# Patient Record
Sex: Female | Born: 1960 | Race: Black or African American | Hispanic: No | Marital: Single | Smoking: Former smoker
Health system: Southern US, Community
[De-identification: ages and names within clinical notes are randomized; demographics above are authoritative.]

## PROBLEM LIST (undated history)

## (undated) DIAGNOSIS — I340 Nonrheumatic mitral (valve) insufficiency: Secondary | ICD-10-CM

## (undated) DIAGNOSIS — E079 Disorder of thyroid, unspecified: Secondary | ICD-10-CM

## (undated) DIAGNOSIS — N183 Chronic kidney disease, stage 3 unspecified: Secondary | ICD-10-CM

## (undated) DIAGNOSIS — I2510 Atherosclerotic heart disease of native coronary artery without angina pectoris: Secondary | ICD-10-CM

## (undated) DIAGNOSIS — I10 Essential (primary) hypertension: Secondary | ICD-10-CM

## (undated) DIAGNOSIS — I313 Pericardial effusion (noninflammatory): Secondary | ICD-10-CM

## (undated) DIAGNOSIS — J45909 Unspecified asthma, uncomplicated: Secondary | ICD-10-CM

## (undated) DIAGNOSIS — I3139 Other pericardial effusion (noninflammatory): Secondary | ICD-10-CM

## (undated) DIAGNOSIS — M1990 Unspecified osteoarthritis, unspecified site: Secondary | ICD-10-CM

## (undated) DIAGNOSIS — Z8701 Personal history of pneumonia (recurrent): Secondary | ICD-10-CM

## (undated) DIAGNOSIS — R002 Palpitations: Secondary | ICD-10-CM

## (undated) HISTORY — DX: Disorder of thyroid, unspecified: E07.9

## (undated) HISTORY — DX: Unspecified asthma, uncomplicated: J45.909

## (undated) HISTORY — DX: Personal history of pneumonia (recurrent): Z87.01

## (undated) HISTORY — DX: Palpitations: R00.2

## (undated) HISTORY — DX: Chronic kidney disease, stage 3 unspecified: N18.30

## (undated) HISTORY — DX: Other pericardial effusion (noninflammatory): I31.39

## (undated) HISTORY — DX: Chronic kidney disease, stage 3 (moderate): N18.3

## (undated) HISTORY — DX: Essential (primary) hypertension: I10

## (undated) HISTORY — DX: Pericardial effusion (noninflammatory): I31.3

---

## 1999-10-27 ENCOUNTER — Encounter: Payer: Self-pay | Admitting: Emergency Medicine

## 1999-10-27 ENCOUNTER — Emergency Department (HOSPITAL_COMMUNITY): Admission: EM | Admit: 1999-10-27 | Discharge: 1999-10-27 | Payer: Self-pay | Admitting: Emergency Medicine

## 2010-05-02 DIAGNOSIS — I340 Nonrheumatic mitral (valve) insufficiency: Secondary | ICD-10-CM

## 2010-05-02 HISTORY — DX: Nonrheumatic mitral (valve) insufficiency: I34.0

## 2010-05-02 HISTORY — PX: PERICARDIOCENTESIS: SHX2215

## 2013-03-26 ENCOUNTER — Emergency Department (HOSPITAL_COMMUNITY)
Admission: EM | Admit: 2013-03-26 | Discharge: 2013-03-27 | Disposition: A | Discharge status: Home / Self Care | Payer: Medicaid Other | Attending: Emergency Medicine | Admitting: Emergency Medicine

## 2013-03-26 ENCOUNTER — Encounter (HOSPITAL_COMMUNITY): Payer: Self-pay | Admitting: Emergency Medicine

## 2013-03-26 DIAGNOSIS — J069 Acute upper respiratory infection, unspecified: Secondary | ICD-10-CM | POA: Insufficient documentation

## 2013-03-26 DIAGNOSIS — IMO0002 Reserved for concepts with insufficient information to code with codable children: Secondary | ICD-10-CM | POA: Insufficient documentation

## 2013-03-26 DIAGNOSIS — R5381 Other malaise: Secondary | ICD-10-CM | POA: Insufficient documentation

## 2013-03-26 DIAGNOSIS — Z79899 Other long term (current) drug therapy: Secondary | ICD-10-CM | POA: Insufficient documentation

## 2013-03-26 DIAGNOSIS — F3289 Other specified depressive episodes: Secondary | ICD-10-CM | POA: Insufficient documentation

## 2013-03-26 DIAGNOSIS — Z7982 Long term (current) use of aspirin: Secondary | ICD-10-CM | POA: Insufficient documentation

## 2013-03-26 DIAGNOSIS — R45851 Suicidal ideations: Secondary | ICD-10-CM | POA: Insufficient documentation

## 2013-03-26 DIAGNOSIS — R51 Headache: Secondary | ICD-10-CM | POA: Insufficient documentation

## 2013-03-26 DIAGNOSIS — I10 Essential (primary) hypertension: Secondary | ICD-10-CM | POA: Insufficient documentation

## 2013-03-26 DIAGNOSIS — F329 Major depressive disorder, single episode, unspecified: Secondary | ICD-10-CM

## 2013-03-26 LAB — URINALYSIS, ROUTINE W REFLEX MICROSCOPIC
Ketones, ur: NEGATIVE mg/dL
Nitrite: NEGATIVE
Protein, ur: NEGATIVE mg/dL
Specific Gravity, Urine: 1.024 (ref 1.005–1.030)
Urobilinogen, UA: 1 mg/dL (ref 0.0–1.0)

## 2013-03-26 LAB — COMPREHENSIVE METABOLIC PANEL
ALT: 38 U/L — ABNORMAL HIGH (ref 0–35)
AST: 42 U/L — ABNORMAL HIGH (ref 0–37)
Alkaline Phosphatase: 243 U/L — ABNORMAL HIGH (ref 39–117)
BUN: 13 mg/dL (ref 6–23)
CO2: 27 mEq/L (ref 19–32)
Calcium: 9.9 mg/dL (ref 8.4–10.5)
GFR calc Af Amer: 77 mL/min — ABNORMAL LOW (ref >90)
GFR calc non Af Amer: 66 mL/min — ABNORMAL LOW (ref >90)
Glucose, Bld: 90 mg/dL (ref 70–99)
Potassium: 3.7 mEq/L (ref 3.5–5.1)
Sodium: 138 mEq/L (ref 135–145)
Total Protein: 8 g/dL (ref 6.0–8.3)

## 2013-03-26 LAB — CBC WITH DIFFERENTIAL/PLATELET
Basophils Absolute: 0 10*3/uL (ref 0.0–0.1)
Basophils Relative: 0 % (ref 0–1)
Eosinophils Absolute: 0 10*3/uL (ref 0.0–0.7)
Eosinophils Relative: 0 % (ref 0–5)
HCT: 43.8 % (ref 36.0–46.0)
Lymphocytes Relative: 8 % — ABNORMAL LOW (ref 12–46)
Lymphs Abs: 0.5 10*3/uL — ABNORMAL LOW (ref 0.7–4.0)
MCHC: 34.9 g/dL (ref 30.0–36.0)
Monocytes Relative: 9 % (ref 3–12)
Platelets: 205 10*3/uL (ref 150–400)
WBC: 6.2 10*3/uL (ref 4.0–10.5)

## 2013-03-26 LAB — RAPID URINE DRUG SCREEN, HOSP PERFORMED
Cocaine: NOT DETECTED
Opiates: NOT DETECTED
Tetrahydrocannabinol: NOT DETECTED

## 2013-03-26 LAB — URINE MICROSCOPIC-ADD ON

## 2013-03-26 LAB — ETHANOL: Alcohol, Ethyl (B): <11 mg/dL (ref 0–11)

## 2013-03-26 MED ORDER — ZOLPIDEM TARTRATE 5 MG PO TABS: 5.0000 mg | ORAL_TABLET | Freq: Every evening | ORAL | Status: DC | PRN

## 2013-03-26 MED ORDER — IBUPROFEN 200 MG PO TABS: 600.0000 mg | ORAL_TABLET | Freq: Three times a day (TID) | ORAL | Status: DC | PRN

## 2013-03-26 MED ORDER — HYDROCODONE-ACETAMINOPHEN 5-325 MG PO TABS
2.0000 | ORAL_TABLET | Freq: Once | ORAL | Status: AC
  Administered 2013-03-26: 2 via ORAL
  Filled 2013-03-26: qty 2

## 2013-03-26 MED ORDER — METOCLOPRAMIDE HCL 5 MG/ML IJ SOLN
10.0000 mg | Freq: Once | INTRAMUSCULAR | Status: AC
  Administered 2013-03-26: 10 mg via INTRAVENOUS
  Filled 2013-03-26: qty 2

## 2013-03-26 MED ORDER — ONDANSETRON HCL 4 MG PO TABS: 4.0000 mg | ORAL_TABLET | Freq: Three times a day (TID) | ORAL | Status: DC | PRN

## 2013-03-26 MED ORDER — FLUTICASONE PROPIONATE 50 MCG/ACT NA SUSP: 2.0000 | Freq: Every day | NASAL | Status: DC

## 2013-03-26 MED ORDER — DIPHENHYDRAMINE HCL 50 MG/ML IJ SOLN
12.5000 mg | Freq: Once | INTRAMUSCULAR | Status: AC
  Administered 2013-03-26: 12.5 mg via INTRAVENOUS
  Filled 2013-03-26: qty 1

## 2013-03-26 MED ORDER — NICOTINE 21 MG/24HR TD PT24: 21.0000 mg | MEDICATED_PATCH | Freq: Every day | TRANSDERMAL | Status: DC

## 2013-03-26 MED ORDER — SODIUM CHLORIDE 0.9 % IV BOLUS (SEPSIS)
1000.0000 mL | Freq: Once | INTRAVENOUS | Status: AC
  Administered 2013-03-26: 1000 mL via INTRAVENOUS

## 2013-03-26 MED ORDER — LORAZEPAM 1 MG PO TABS: 1.0000 mg | ORAL_TABLET | Freq: Three times a day (TID) | ORAL | Status: DC | PRN

## 2013-03-26 MED ORDER — ACETAMINOPHEN 325 MG PO TABS
650.0000 mg | ORAL_TABLET | Freq: Once | ORAL | Status: AC
  Administered 2013-03-26: 650 mg via ORAL
  Filled 2013-03-26: qty 2

## 2013-03-26 MED ORDER — KETOROLAC TROMETHAMINE 30 MG/ML IJ SOLN
30.0000 mg | Freq: Once | INTRAMUSCULAR | Status: AC
  Administered 2013-03-26: 30 mg via INTRAVENOUS
  Filled 2013-03-26: qty 1

## 2013-03-26 MED ORDER — ALUM & MAG HYDROXIDE-SIMETH 200-200-20 MG/5ML PO SUSP: 30.0000 mL | ORAL | Status: DC | PRN

## 2013-03-26 NOTE — Discharge Instructions (Signed)
1. Medications: flonase, usual home medications 2. Treatment: rest, drink plenty of fluids, take OTC cold medication as needed, begin mucinex 3. Follow Up: Please followup with your primary doctor for discussion of your diagnoses and further evaluation after today's visit; if you do not have a primary care doctor use the resource guide provided to find one; Please also use the resources to find a counselor to discuss your depression.      Depression, Adult Depression refers to feeling sad, low, down in the dumps, blue, gloomy, or empty. In general, there are two kinds of depression: 1. Depression that we all experience from time to time because of upsetting life experiences, including the loss of a job or the ending of a relationship (normal sadness or normal grief). This kind of depression is considered normal, is short lived, and resolves within a few days to 2 weeks. (Depression experienced after the loss of a loved one is called bereavement. Bereavement often lasts longer than 2 weeks but normally gets better with time.) 2. Clinical depression, which lasts longer than normal sadness or normal grief or interferes with your ability to function at home, at work, and in school. It also interferes with your personal relationships. It affects almost every aspect of your life. Clinical depression is an illness. Symptoms of depression also can be caused by conditions other than normal sadness and grief or clinical depression. Examples of these conditions are listed as follows:  Physical illness Some physical illnesses, including underactive thyroid gland (hypothyroidism), severe anemia, specific types of cancer, diabetes, uncontrolled seizures, heart and lung problems, strokes, and chronic pain are commonly associated with symptoms of depression.  Side effects of some prescription medicine In some people, certain types of prescription medicine can cause symptoms of depression.  Substance abuse Abuse of  alcohol and illicit drugs can cause symptoms of depression. SYMPTOMS Symptoms of normal sadness and normal grief include the following:  Feeling sad or crying for short periods of time.  Not caring about anything (apathy).  Difficulty sleeping or sleeping too much.  No longer able to enjoy the things you used to enjoy.  Desire to be by oneself all the time (social isolation).  Lack of energy or motivation.  Difficulty concentrating or remembering.  Change in appetite or weight.  Restlessness or agitation. Symptoms of clinical depression include the same symptoms of normal sadness or normal grief and also the following symptoms:  Feeling sad or crying all the time.  Feelings of guilt or worthlessness.  Feelings of hopelessness or helplessness.  Thoughts of suicide or the desire to harm yourself (suicidal ideation).  Loss of touch with reality (psychotic symptoms). Seeing or hearing things that are not real (hallucinations) or having false beliefs about your life or the people around you (delusions and paranoia). DIAGNOSIS  The diagnosis of clinical depression usually is based on the severity and duration of the symptoms. Your caregiver also will ask you questions about your medical history and substance use to find out if physical illness, use of prescription medicine, or substance abuse is causing your depression. Your caregiver also may order blood tests. TREATMENT  Typically, normal sadness and normal grief do not require treatment. However, sometimes antidepressant medicine is prescribed for bereavement to ease the depressive symptoms until they resolve. The treatment for clinical depression depends on the severity of your symptoms but typically includes antidepressant medicine, counseling with a mental health professional, or a combination of both. Your caregiver will help to determine what treatment is  best for you. Depression caused by physical illness usually goes away  with appropriate medical treatment of the illness. If prescription medicine is causing depression, talk with your caregiver about stopping the medicine, decreasing the dose, or substituting another medicine. Depression caused by abuse of alcohol or illicit drugs abuse goes away with abstinence from these substances. Some adults need professional help in order to stop drinking or using drugs. SEEK IMMEDIATE CARE IF:  You have thoughts about hurting yourself or others.  You lose touch with reality (have psychotic symptoms).  You are taking medicine for depression and have a serious side effect. FOR MORE INFORMATION National Alliance on Mental Illness: www.nami.[employer] of Mental Health: http://www.maynard.net/ Document Released: 04/15/2000 Document Revised: 10/18/2011 Document Reviewed: 07/18/2011 [facility] Patient Information 2014 [place], [place].       Behavioral Health Resources in the [facility]  Intensive Outpatient Programs: [facility]      [address] [place], [place] [phone] Both a day and evening program       [facility] Outpatient     [address]        [place], [place] [address] [phone]         ADS: [facility] [address] [place] [phone]  [facility] Mental Health ACCESS LINE: [phone] or [phone] [address] [place], [place] [address] [email or web address]  Mobile Crisis Teams:                                        [facility]         Mobile Crisis Care Unit [phone]             [facility] [address] [place] Otto [phone]                                         [facility] [address] [place] [address] [place] [phone]  Self-Help/Support Groups: Mental Health Assoc[facility] of [employer] of support groups [phone] (call for more  info)  Narcotics Anonymous (NA) [facility] [address] [place] - 2 meetings at this location  Residential Treatment Programs:  [facility]      [address]        [place]       [phone]         [facility] [address], [place] [address] [place], [place]  [address] [phone]  [facility]  [address] [place], [place] [address] [phone] Admissions: 8am-3pm M-F  [facility]     [address]        [place], [place] [address]       [phone]         [facility] [address] [place], [place] [phone]  [facility] [address] [place], [place] [phone]  [facility] - Intensive Outpatient      [address]     [place], [place]       [phone]         [facility] ([facility]     [address] [place], [place] [phone] or [phone]  [facility] ([facility])  [address] [place], [place] [phone]  [facility] [address]                                               [address] [place] [phone]  [facility] Resources: [facility]217 [phone]               General Therapy                                                Angie Fava, PhD        [address] [place], [place] [address]         [phone]   Insurance  [facility]   [address] [place], [place] [address] [phone]  [facility] [address] [place], [place] [address] [phone] Insurance/Medicaid/sponsorship through [facility] and Families                                              [address]                                        [place], [place] [address]    Therapy/tele-psych/case         [phone]          [facility] [address], [place]  [address]   Adolescent/group home/case management [phone]                                           Creola Corn PhD       General therapy       Insurance   [phone]         Dr. Lolly Mustache Insurance [phone] M-F  [facility] Medicaid, sponsorship [phone]

## 2013-03-26 NOTE — ED Notes (Addendum)
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

## 2013-03-26 NOTE — ED Notes (Signed)
Pt has been changed into blue scrubs and pt belongings are at the bedside.

## 2013-03-26 NOTE — ED Notes (Signed)
Marchelle Folks from TTS consult called to inform that pt will have a telepsych scheduled at 2100.

## 2013-03-26 NOTE — ED Notes (Signed)
Pt personal medications with belongings: Levothyroxine , Carvedilol 3.125mg , Losartan 100mg , and OTC tylenol cold tablets.

## 2013-03-26 NOTE — ED Provider Notes (Signed)
CSN: [id number]     Arrival date & time 03/26/13  4403 History   First MD Initiated Contact with Patient 03/26/13 1825     Chief Complaint  Patient presents with  . Migraine   (Consider location/radiation/quality/duration/timing/severity/associated sxs/prior Treatment) Patient is a 52 y.o. female presenting with migraines. The history is provided by the patient and medical records. No language interpreter was used.  Migraine Associated symptoms include congestion, coughing, fatigue and headaches. Pertinent negatives include no abdominal pain, arthralgias, chest pain, chills, fever, myalgias, nausea, numbness, rash, sore throat or vomiting.    Suzanne Zuniga is a 52 y.o. female  with a hx of HTN presents to the Emergency Department complaining of gradual, persistent, progressively worsening temporal headache onset 2 days ago with associated nasal congestion, scratchy throat, rhinorrhea, dry cough, subjective fever and general malaise.  Pt denies neck stiffness, rash, changes in vision, thunderclap headache.  She has no hx of migraine headache.  Pt has been taking OTC cold medication without relief.  Pt denies chills, neck stiffness, chest pain, SOB, abd pain, N/V/D, weakness, dizziness, syncope, dysuria, hematuria.      She endorses increased stress at home.  She reports she is recently separated and living at someone else's home.  She endorses worthlessness and hopelessness.  She also reports vage thoughts of suicide for several weeks without a specific plan.  She denies firearms in the home.  She endorses Hx of suicide attempt in 2013 by lacerating her wrist. She denies HI, auditory/visual hallucinations.     Past Medical History  Diagnosis Date  . Hypertension   . Enlarged heart    Past Surgical History  Procedure Laterality Date  . Coronary stent placement     No family history on file. History  Substance Use Topics  . Smoking status: Never Smoker   . Smokeless tobacco: Not on file   . Alcohol Use: No   OB History   Grav Para Term Preterm Abortions TAB SAB Ect Mult Living                 Review of Systems  Constitutional: Positive for fatigue. Negative for fever, chills and appetite change.  HENT: Positive for congestion, postnasal drip, rhinorrhea and sinus pressure. Negative for ear discharge, ear pain, mouth sores and sore throat.   Eyes: Negative for visual disturbance.  Respiratory: Positive for cough. Negative for chest tightness, shortness of breath, wheezing and stridor.   Cardiovascular: Negative for chest pain, palpitations and leg swelling.  Gastrointestinal: Negative for nausea, vomiting, abdominal pain and diarrhea.  Genitourinary: Negative for dysuria, urgency, frequency and hematuria.  Musculoskeletal: Negative for arthralgias, back pain, myalgias and neck stiffness.  Skin: Negative for rash.  Neurological: Positive for headaches. Negative for syncope, light-headedness and numbness.  Hematological: Negative for adenopathy.  Psychiatric/Behavioral: Positive for suicidal ideas. The patient is not nervous/anxious.   All other systems reviewed and are negative.    Allergies  Review of patient's allergies indicates no known allergies.  Home Medications   Current Outpatient Rx  Name  Route  Sig  Dispense  Refill  . acetaminophen (TYLENOL) 500 MG tablet   Oral   Take 1,500 mg by mouth every 6 (six) hours as needed for moderate pain, fever or headache.         Marland Kitchen aspirin EC 81 MG tablet   Oral   Take 81 mg by mouth every morning.         . carvedilol (COREG) 3.125 MG tablet  Oral   Take 3.125 mg by mouth 2 (two) times daily.         Marland Kitchen levothyroxine (SYNTHROID, LEVOTHROID) 100 MCG tablet   Oral   Take 100 mcg by mouth daily before breakfast.         . losartan (COZAAR) 100 MG tablet   Oral   Take 100 mg by mouth every morning.         . Misc Natural Products (GINSENG COMPLEX PO)   Oral   Take 1 tablet by mouth every  morning.         . fluticasone (FLONASE) 50 MCG/ACT nasal spray   Each Nare   Place 2 sprays into both nostrils daily.   16 g   2    BP 98/48  Pulse 84  Temp(Src) 98.8 F (37.1 C) (Oral)  Resp 18  Ht 5\' 4"  (1.626 m)  Wt 167 lb (75.751 kg)  BMI 28.65 kg/m2  SpO2 96% Physical Exam  Constitutional: She is oriented to person, place, and time. She appears well-developed and well-nourished. No distress.  HENT:  Head: Normocephalic and atraumatic.  Right Ear: Tympanic membrane, external ear and ear canal normal.  Left Ear: Tympanic membrane, external ear and ear canal normal.  Nose: Mucosal edema and rhinorrhea present. No epistaxis. Right sinus exhibits no maxillary sinus tenderness and no frontal sinus tenderness. Left sinus exhibits no maxillary sinus tenderness and no frontal sinus tenderness.  Mouth/Throat: Uvula is midline, oropharynx is clear and moist and mucous membranes are normal. Mucous membranes are not pale and not cyanotic. No oropharyngeal exudate, posterior oropharyngeal edema, posterior oropharyngeal erythema or tonsillar abscesses.  Eyes: Conjunctivae are normal. Pupils are equal, round, and reactive to light.  Neck: Normal range of motion and full passive range of motion without pain.  Cardiovascular: Normal rate, normal heart sounds and intact distal pulses.   No murmur heard. Pulmonary/Chest: Effort normal and breath sounds normal. No stridor. No respiratory distress. She has no wheezes.  Abdominal: Soft. Normal appearance and bowel sounds are normal. She exhibits no distension. There is no tenderness. There is no rebound, no guarding and no CVA tenderness.  Musculoskeletal: Normal range of motion.  Lymphadenopathy:    She has no cervical adenopathy.  Neurological: She is alert and oriented to person, place, and time. No cranial nerve deficit. She exhibits normal muscle tone. Coordination normal.  Speech is clear and goal oriented, follows commands Major Cranial  nerves without deficit, no facial droop Normal strength in upper and lower extremities bilaterally including dorsiflexion and plantar flexion, strong and equal grip strength Sensation normal to light and sharp touch Moves extremities without ataxia, coordination intact Normal finger to nose and rapid alternating movements Neg romberg, no pronator drift Normal gait and balance  Skin: Skin is warm and dry. No rash noted. She is not diaphoretic. No erythema.  Pt hot to touch  Psychiatric: Her behavior is normal. She is not actively hallucinating. She exhibits a depressed mood. She expresses suicidal ideation. She expresses no homicidal ideation. She expresses no suicidal plans and no homicidal plans.    ED Course  Procedures (including critical care time) Labs Review Labs Reviewed  URINALYSIS, ROUTINE W REFLEX MICROSCOPIC - Abnormal; Notable for the following:    APPearance CLOUDY (*)    Leukocytes, UA MODERATE (*)    All other components within normal limits  CBC WITH DIFFERENTIAL - Abnormal; Notable for the following:    Hemoglobin 15.3 (*)    Neutrophils Relative %  83 (*)    Lymphocytes Relative 8 (*)    Lymphs Abs 0.5 (*)    All other components within normal limits  COMPREHENSIVE METABOLIC PANEL - Abnormal; Notable for the following:    AST 42 (*)    ALT 38 (*)    Alkaline Phosphatase 243 (*)    Total Bilirubin 1.5 (*)    GFR calc non Af Amer 66 (*)    GFR calc Af Amer 77 (*)    All other components within normal limits  URINE MICROSCOPIC-ADD ON - Abnormal; Notable for the following:    Squamous Epithelial / LPF FEW (*)    Bacteria, UA FEW (*)    All other components within normal limits  URINE CULTURE  URINE RAPID DRUG SCREEN (HOSP PERFORMED)  ETHANOL   Imaging Review No results found.  EKG Interpretation   None       MDM   1. Depression   2. Headache   3. URI (upper respiratory infection)      Suzanne Zuniga presents with headache without sudden onset or  thunderclap.  Pt with URI symptoms and increased stress at home.  During evaluation, pt endorses increased depression with vague SI and no plan.  Pt is at higher risk for suicide attempt as she has a Hx of same.    9:42 PM Discussed with Marchelle Folks of psyc who sees the same concerns I did, but she does not meet admission criteria.  She will give pt referrals for outpatient treatment.  Before d/c we will ensure that pt's friend with whom she currently lives will assist in ensuring that the patient is able to follow-up with [facility] or other outpatient resources.    10:51 PM Pt with resolution of headache.  She remains neurovascularly intact without deficit.  No CXR due to short duration of symptoms and clear and equal breath sounds. Patients symptoms are consistent with URI, likely viral etiology. Discussed that antibiotics are not indicated for viral infections. Pt will be discharged with symptomatic treatment.  Verbalizes understanding and is agreeable with plan. Pt is hemodynamically stable & in NAD prior to dc.  Pt's friend has been contacted and agrees to help with f/u.  She will contract for her safety and is to be d/c home.  It has been determined that no acute conditions requiring further emergency intervention are present at this time. The patient/guardian have been advised of the diagnosis and plan. We have discussed signs and symptoms that warrant return to the ED, such as changes or worsening in symptoms.   Vital signs are stable at discharge.   BP 98/48  Pulse 84  Temp(Src) 98.8 F (37.1 C) (Oral)  Resp 18  Ht 5\' 4"  (1.626 m)  Wt 167 lb (75.751 kg)  BMI 28.65 kg/m2  SpO2 96%  Patient/guardian has voiced understanding and agreed to follow-up with the PCP or specialist.       Dierdre Forth, PA-C 03/26/13 2256

## 2013-03-26 NOTE — BH Assessment (Signed)
[facility] Assessment Progress Note      Received request for TTS consult on Ms Janosik.  Spoke with Dahlia Client, EDPA, who requested the consult and she reprots Ms Yearwood is a 52 year old lday complaining of headache, but also endorsing SI-vague no plan, but history of attempt last year by slitting her wrist, separation, living with someone else.  Dahlia Client, [place], thinks pt is probably appropriate for discharge, but wants TTS consult to be sure.
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
## 2013-03-26 NOTE — Progress Notes (Signed)
EDCM spoke to patient at bedside.  Patient confirms she has Medicaid insurance.  EDCM questioned patient as to if she has a pcp.  Patent stated, "I have a doctor, but I'm in the process of switching doctors."  Patient does not know what physician she is switching to.  [facility] provided patient with a list of pcps who accept Medicaid insurance in [place].  Patient thankful for resources.  No further CM needs at this itme.

## 2013-03-26 NOTE — ED Notes (Signed)
Pt states  That she has been taking Tylenol cold and sinus but not helped her migraine that started earlier today. Pt denies n/v and blurred vision. Pt states that she bets her BP is high because it elevates every time she get sick.

## 2013-03-28 LAB — URINE CULTURE: Colony Count: >=100000

## 2013-03-29 NOTE — ED Provider Notes (Signed)
Medical screening examination/treatment/procedure(s) were performed by non-physician practitioner and as supervising physician I was immediately available for consultation/collaboration.  EKG Interpretation   None        Kamau Weatherall, MD 03/29/13 0837 

## 2013-12-16 ENCOUNTER — Ambulatory Visit: Payer: Medicaid Other | Admitting: Cardiology

## 2014-02-11 ENCOUNTER — Ambulatory Visit: Payer: Medicaid Other | Admitting: Cardiology

## 2014-02-11 ENCOUNTER — Encounter: Payer: Self-pay | Admitting: Cardiology

## 2014-02-11 ENCOUNTER — Ambulatory Visit: Payer: Self-pay | Admitting: Cardiology

## 2014-02-11 ENCOUNTER — Ambulatory Visit (INDEPENDENT_AMBULATORY_CARE_PROVIDER_SITE_OTHER): Payer: Medicaid Other | Admitting: Cardiology

## 2014-02-11 VITALS — BP 124/82 | HR 61 | Ht 64.0 in | Wt 176.5 lb

## 2014-02-11 DIAGNOSIS — I3139 Other pericardial effusion (noninflammatory): Secondary | ICD-10-CM

## 2014-02-11 DIAGNOSIS — I509 Heart failure, unspecified: Secondary | ICD-10-CM

## 2014-02-11 DIAGNOSIS — I313 Pericardial effusion (noninflammatory): Secondary | ICD-10-CM

## 2014-02-11 DIAGNOSIS — I10 Essential (primary) hypertension: Secondary | ICD-10-CM

## 2014-02-11 NOTE — Patient Instructions (Signed)
WILL OBTAIN INFORMATION FROM [place]  Your physician wants you to follow-up in 2 MONTHS DR HARDING.  You will receive a reminder letter in the mail two months in advance. If you don't receive a letter, please call our office to schedule the follow-up appointment.

## 2014-02-13 ENCOUNTER — Encounter: Payer: Self-pay | Admitting: Cardiology

## 2014-02-13 DIAGNOSIS — I3139 Other pericardial effusion (noninflammatory): Secondary | ICD-10-CM | POA: Insufficient documentation

## 2014-02-13 DIAGNOSIS — I10 Essential (primary) hypertension: Secondary | ICD-10-CM | POA: Insufficient documentation

## 2014-02-13 DIAGNOSIS — I313 Pericardial effusion (noninflammatory): Secondary | ICD-10-CM | POA: Insufficient documentation

## 2014-02-13 DIAGNOSIS — I509 Heart failure, unspecified: Secondary | ICD-10-CM | POA: Insufficient documentation

## 2014-02-13 NOTE — Assessment & Plan Note (Signed)
Well-controlled on ARB and carvedilol.

## 2014-02-13 NOTE — Assessment & Plan Note (Signed)
With abl gene, and no recurrent symptoms I don't think that this is an active issue. We began to get the old echocardiogram reports as well as potentially CT scan reports to have a baseline. Once we know more when dealing with a history standpoint and consider what we need to do for reevaluation.

## 2014-02-13 NOTE — Assessment & Plan Note (Signed)
This was listed as a possible diagnosis for her, he can't tell if he classically had heart failure symptoms or has any etiology for heart failure. Again once we know more what her baseline is we can work this up the story. For now patient seems very stable he is on an ARB and beta blocker, not requiring a diuretic.

## 2014-02-13 NOTE — Progress Notes (Signed)
PATIENT: Suzanne ClayWanda Done MRN: [id number] DOB: 30-Oct-1960 PCP: Margaretann LovelessKHAN,NEELAM S, MD  Clinic Note: Chief Complaint  Patient presents with  . New Evaluation    MOVED FROM [place], HEART ATTACK 2012 -FLUID AROUND HEART, INFECTED LUNG 2012, CHEST PAIN , CRAMPS AROUNDHEART SHARP,NUMBNES BOTH ARMS,    HPI: Suzanne Zuniga is a 53 y.o. female with a PMH below who presents today for establishing cardiology care. She has recently moved from [place]. She describes a rate difficult episode that occurred back in 2012 when she apparently was being treated for pneumonia she had a chest x-ray done that showed an enlarged heart. She was then sent to cardiology an echocardiogram probably that showed a large pericardial effusion. She subsequently went to the hospital and was treated with pericardiocentesis, she does none of the details of the procedure were the findings. I don't think there was a true diagnosis determined. She was then given a diagnosis of heart diet. Apparently during that hospitalization she had some complications one of which was that she suffered cardiac arrest and was resuscitated. Again I don't have the details of this..  Interval History: She now presents to establish cardiology care. She denies any heart failure symptoms of PND, orthopnea but does have some mild occasional edema. She basically has diffuse aching and cramping sensations in her chest and arms. She has occasional tiredness and dizziness. She really doesn't note any sharp or stabbing chest discomfort that is persistent or positional. She does have some intermittent episodes that are hard to define. No anginal sounding symptoms or dyspnea with rest or exertion. She denies any rapid or irregular heartbeat/arrhythmias, does have intermittent palpitations. She denies any syncope or near-syncope just fatigue.  Past Medical History  Diagnosis Date  . Essential hypertension   . Idiopathic pericardial effusion     Uncertain etiology; was in  setting of PNA has also been evaluated for inflammatory arthritis.;   . Thyroid disease     HYPOTHYROID  . History of pneumonia   . Reactive airway disease     No diagnosis listed, but on albuterol and Symbicort    Prior Cardiac Evaluation and Past Surgical History: Past Surgical History  Procedure Laterality Date  . Coronary stent placement      ?? no cath report  . Pericardiocentesis  2012    Allergies  Allergen Reactions  . Aliskiren   . Amlodipine   . Lisinopril   . Lithium     Current Outpatient Prescriptions  Medication Sig Dispense Refill  . acetaminophen (TYLENOL) 500 MG tablet Take 1,500 mg by mouth every 6 (six) hours as needed for moderate pain, fever or headache.      . albuterol (PROAIR HFA) 108 (90 BASE) MCG/ACT inhaler Inhale 2 puffs into the lungs.      Marland Kitchen. aspirin EC 81 MG tablet Take 81 mg by mouth every morning.      . budesonide-formoterol (SYMBICORT) 160-4.5 MCG/ACT inhaler Inhale 2 puffs into the lungs.      . carvedilol (COREG) 3.125 MG tablet Take 3.125 mg by mouth 2 (two) times daily.      . citalopram (CELEXA) 20 MG tablet Take 20 mg by mouth daily.      . fluticasone (FLONASE) 50 MCG/ACT nasal spray Place 2 sprays into both nostrils daily.  16 g  2  . levothyroxine (SYNTHROID, LEVOTHROID) 100 MCG tablet Take 100 mcg by mouth daily before breakfast.      . losartan (COZAAR) 100 MG tablet Take 100 mg by  mouth every morning.       No current facility-administered medications for this visit.    History   Social History Narrative   Single. Former smoker. Occasional alcohol.    family history is not on file. by her report she denies any family history of heart disease or arrhythmias. No prior pericardial effusions.   ROS: A comprehensive Review of Systems - was performed Review of Systems  Constitutional:       Tiredness, but not fatigue  HENT: Negative for congestion, hearing loss, nosebleeds and tinnitus.   Respiratory: Negative for cough,  shortness of breath and wheezing.   Cardiovascular: Negative for chest pain.  Gastrointestinal: Negative for blood in stool and melena.  Genitourinary: Negative for dysuria, frequency, hematuria and flank pain.  Musculoskeletal: Positive for back pain, joint pain, myalgias and neck pain. Negative for falls.  Neurological: Positive for dizziness. Negative for sensory change, speech change, focal weakness, seizures, loss of consciousness and headaches.  All other systems reviewed and are negative.   PHYSICAL EXAM BP 124/82  Pulse 61  Ht 5\' 4"  (1.626 m)  Wt 176 lb 8 oz (80.06 kg)  BMI 30.28 kg/m2 General appearance: alert, cooperative, appears stated age, no distress, mildly obese and Well-groomed Neck: no adenopathy, no carotid bruit, no JVD and supple, symmetrical, trachea midline Lungs: clear to auscultation bilaterally, normal percussion bilaterally and Nonlabored, good air movement Heart: regular rate and rhythm, S1, S2 normal, no murmur, click, rub or gallop and normal apical impulse Abdomen: soft, non-tender; bowel sounds normal; no masses,  no organomegaly Extremities: extremities normal, atraumatic, no cyanosis or edema Pulses: 2+ and symmetric Skin: Skin color, texture, turgor normal. No rashes or lesions Neurologic: Alert and oriented X 3, normal strength and tone. Normal symmetric reflexes. Normal coordination and gait Cranial nerves: normal   Adult ECG Report  Rate: 61 ;  Rhythm: normal sinus rhythm, anterolateral T wave inversions and poor wave progression. Cannot exclude ischemia. No previous EKG  Recent Labs: No recent labs  ASSESSMENT / PLAN: Very unfortunate situation to him see a patient without any past medical records. She seems to be having some atypical sounding discomforts that are not cardiac in nature. She doesn't seem to have any symptoms to suggest recurrence or pericardial effusion. No heart failure symptoms no pleuritic type pains. I think she had to go on  last year, I can't be sure. The main focus for now is going to be to get her old records in order to know more about dealing with see her in followup. For now we'll not have any significant symptoms, there is no need to preemptively take any steps towards echocardiogram or stress test.  Idiopathic pericardial effusion With abl gene, and no recurrent symptoms I don't think that this is an active issue. We began to get the old echocardiogram reports as well as potentially CT scan reports to have a baseline. Once we know more when dealing with a history standpoint and consider what we need to do for reevaluation.  Essential hypertension Well-controlled on ARB and carvedilol.  Chronic heart failure This was listed as a possible diagnosis for her, he can't tell if he classically had heart failure symptoms or has any etiology for heart failure. Again once we know more what her baseline is we can work this up the story. For now patient seems very stable he is on an ARB and beta blocker, not requiring a diuretic.    No orders of the defined types were  placed in this encounter.   Meds ordered this encounter  Medications  . albuterol (PROAIR HFA) 108 (90 BASE) MCG/ACT inhaler    Sig: Inhale 2 puffs into the lungs.  . budesonide-formoterol (SYMBICORT) 160-4.5 MCG/ACT inhaler    Sig: Inhale 2 puffs into the lungs.  . citalopram (CELEXA) 20 MG tablet    Sig: Take 20 mg by mouth daily.    Followup: 2  Months - but without point we will have her chart from [employer]. Herbie Baltimore, M.D., M.S. [facility]

## 2014-03-07 NOTE — Addendum Note (Signed)
Addended by: Abram SanderSIGMON, Mariesha Venturella J on: 03/07/2014 03:30 PM   Modules accepted: Orders

## 2014-04-16 ENCOUNTER — Ambulatory Visit: Payer: Medicaid Other | Admitting: Cardiology

## 2014-04-22 ENCOUNTER — Ambulatory Visit (INDEPENDENT_AMBULATORY_CARE_PROVIDER_SITE_OTHER): Payer: Medicaid Other | Admitting: Cardiology

## 2014-04-22 ENCOUNTER — Encounter: Payer: Self-pay | Admitting: Cardiology

## 2014-04-22 VITALS — BP 142/85 | HR 79 | Ht 64.0 in | Wt 187.2 lb

## 2014-04-22 DIAGNOSIS — I509 Heart failure, unspecified: Secondary | ICD-10-CM

## 2014-04-22 DIAGNOSIS — R0600 Dyspnea, unspecified: Secondary | ICD-10-CM

## 2014-04-22 DIAGNOSIS — E669 Obesity, unspecified: Secondary | ICD-10-CM

## 2014-04-22 DIAGNOSIS — I10 Essential (primary) hypertension: Secondary | ICD-10-CM

## 2014-04-22 DIAGNOSIS — I3139 Other pericardial effusion (noninflammatory): Secondary | ICD-10-CM

## 2014-04-22 DIAGNOSIS — I313 Pericardial effusion (noninflammatory): Secondary | ICD-10-CM

## 2014-04-22 DIAGNOSIS — I319 Disease of pericardium, unspecified: Secondary | ICD-10-CM

## 2014-04-22 NOTE — Patient Instructions (Signed)
Your physician has requested that you have an echocardiogram. Echocardiography is a painless test that uses sound waves to create images of your heart. It provides your doctor with information about the size and shape of your heart and how well your heart's chambers and valves are working. This procedure takes approximately one hour. There are no restrictions for this procedure.  Your physician wants you to follow-up in: 6 months with Dr. Herbie BaltimoreHarding. You will receive a reminder letter in the mail two months in advance. If you don't receive a letter, please call our office to schedule the follow-up appointment.

## 2014-04-23 ENCOUNTER — Encounter: Payer: Self-pay | Admitting: Cardiology

## 2014-04-23 DIAGNOSIS — E669 Obesity, unspecified: Secondary | ICD-10-CM | POA: Insufficient documentation

## 2014-04-23 DIAGNOSIS — R0600 Dyspnea, unspecified: Secondary | ICD-10-CM | POA: Insufficient documentation

## 2014-04-23 DIAGNOSIS — R0609 Other forms of dyspnea: Secondary | ICD-10-CM | POA: Insufficient documentation

## 2014-04-23 NOTE — Progress Notes (Signed)
PATIENT: Suzanne ClayWanda Zuniga MRN: [id number] DOB: 12-24-1960 PCP: Margaretann LovelessKHAN,NEELAM S, MD  Clinic Note: Chief Complaint  Patient presents with  . Follow-up    2 month follow up, Pt. experiencing chest pressure, chest pain, SOB, and swelling from fall.   HPI: Suzanne Zuniga is a 53 y.o. female with prior history of likely parapneumonic pericardial effusion status post pericardiocentesis back in 2012. There was reported "heart attack at that time, but no documented CAD. I will see her back now for 2 months follow-up, but unfortunately do not yet have records from [facility].apparently there is a report of possible PCI, however she does not remember having a heart catheterization.  Interval History: She now presents for follow-up visit, unfortunately I don't have any more records to go from. She has some diffuse mild symptoms that are likely musculoskeletal in nature with mild aches and pains in the chest. Most of what she is feeling now is residual symptoms from a fall down the steps where she hit her back and chest as well as pain. She has some bruising in her chest and so her to take deep breath. No real resting or exertional chest tightness or pressure consistent with angina. Only notes exertional dyspnea when she really pushes it. She denies any heart failure symptoms of PND, orthopnea but does have some mild occasional edema. She basically has diffuse aching and cramping sensations in her chest and arms. She has occasional tiredness and dizziness. She denies any rapid or irregular heartbeat/arrhythmias, does have intermittent palpitations. She denies any syncope or near-syncope just fatigue.  Past Medical History  Diagnosis Date  . Essential hypertension   . Idiopathic pericardial effusion     Uncertain etiology; was in setting of PNA has also been evaluated for inflammatory arthritis.;   . Thyroid disease     HYPOTHYROID  . History of pneumonia   . Reactive airway disease     No diagnosis  listed, but on albuterol and Symbicort    Prior Cardiac Evaluation and Past Surgical History: Past Surgical History  Procedure Laterality Date  . Pericardiocentesis  2012    Likely parapneumonic    Allergies  Allergen Reactions  . Aliskiren   . Amlodipine   . Lisinopril   . Lithium     Current Outpatient Prescriptions  Medication Sig Dispense Refill  . albuterol (PROAIR HFA) 108 (90 BASE) MCG/ACT inhaler Inhale 2 puffs into the lungs.    Marland Kitchen. aspirin EC 81 MG tablet Take 81 mg by mouth every morning.    . budesonide-formoterol (SYMBICORT) 160-4.5 MCG/ACT inhaler Inhale 2 puffs into the lungs.    . carvedilol (COREG) 3.125 MG tablet Take 3.125 mg by mouth 2 (two) times daily.    . citalopram (CELEXA) 20 MG tablet Take 20 mg by mouth daily.    . fluticasone (FLONASE) 50 MCG/ACT nasal spray Place 2 sprays into both nostrils daily. 16 g 2  . levothyroxine (SYNTHROID, LEVOTHROID) 100 MCG tablet Take 100 mcg by mouth daily before breakfast.    . losartan (COZAAR) 100 MG tablet Take 100 mg by mouth every morning.    Marland Kitchen. ibuprofen (ADVIL,MOTRIN) 600 MG tablet Take 600 mg by mouth every 6 (six) hours as needed.  0   No current facility-administered medications for this visit.   No Significant Change in Social and Family History  ROS: A comprehensive Review of Systems - was performed Review of Systems  Constitutional: Positive for malaise/fatigue (Due to poor sleep).  Tiredness, but not fatigue  HENT: Negative for congestion, hearing loss, nosebleeds and tinnitus.   Respiratory: Negative for cough, shortness of breath and wheezing.   Cardiovascular: Negative for chest pain.  Gastrointestinal: Negative for blood in stool and melena.  Genitourinary: Negative for dysuria, frequency, hematuria and flank pain.  Musculoskeletal: Positive for myalgias, back pain, joint pain and neck pain. Negative for falls.  Neurological: Positive for dizziness. Negative for sensory change, speech change,  focal weakness, seizures, loss of consciousness and headaches.  Endo/Heme/Allergies: Does not bruise/bleed easily.  Psychiatric/Behavioral: Negative for memory loss. The patient is nervous/anxious and has insomnia (Has a hard time sleeping with teenagers and young adults in the house. Quite stressed out about Christmas time holidays).   All other systems reviewed and are negative.   PHYSICAL EXAM BP 142/85 mmHg  Pulse 79  Ht 5\' 4"  (1.626 m)  Wt 187 lb 3.2 oz (84.913 kg)  BMI 32.12 kg/m2 General appearance: alert, cooperative, appears stated age, no distress, mildly obese and Well-groomed Neck: no adenopathy, no carotid bruit, no JVD and supple, symmetrical, trachea midline Lungs: clear to auscultation bilaterally, normal percussion bilaterally and Nonlabored, good air movement Heart: regular rate and rhythm, S1, S2 normal, no murmur, click, rub or gallop and normal apical impulse Abdomen: soft, non-tender; bowel sounds normal; no masses,  no organomegaly Extremities: extremities normal, atraumatic, no cyanosis or edema Pulses: 2+ and symmetric Skin: Skin color, texture, turgor normal. No rashes or lesions Neurologic: Alert and oriented X 3, normal strength and tone. Normal symmetric reflexes. Normal coordination and gait Cranial nerves: normal   Adult ECG Report -not performed   Recent Labs: No recent labs  ASSESSMENT / PLAN: Unfortunately, I don't have any records of. I would like to have a baseline sense of her cardiac function and plus or minus pericardial effusion. We'll check an echocardiogram for that baseline assessment. Otherwise relatively stable cardiac standpoint.   Idiopathic pericardial effusion Most likely was in the setting of a pneumonia, therefore parapneumonic. However she does have a history of positive ANA titers.  Plan: Check baseline echocardiogram just to ensure no recurrent effusions. We'll also assess EF.  Chronic heart failure Listed as a possible  diagnosis. She is on carvedilol and ARB. Since she has not had an echo in over a year I think is not unreasonable to go ahead with an echocardiogram now to get a new baseline for her. No active heart failure symptoms requiring diuretic.  Essential hypertension Relatively well-controlled on ARB and carvedilol. Her pressure is low but higher today, but she is under a great deal of stress.  Obesity (BMI 30-39.9) We discussed importance of dietary modification and exercise. Since she is not working, she should have the time to be a get basic exercise such as walking. She would like to see results the echocardiogram before starting down this road.  Dyspnea Cannot be certain that she has dyspnea from heart failure or baseline lung disease. Currently does not a major issue for her, possibly because she just not very active.  2-D echocardiogram will help assess if there is any systolic or diastolic heart failure component versus possible persistent Effusion.    Orders Placed This Encounter  Procedures  . 2D Echocardiogram without contrast    Standing Status: Future     Number of Occurrences:      Standing Expiration Date: 04/23/2015    Order Specific Question:  Type of Echo    Answer:  Complete    Order Specific  Question:  Where should this test be performed    Answer:  [facility]    Order Specific Question:  Reason for exam-Echo    Answer:  Dyspnea  786.09 / R06.00   Meds ordered this encounter  Medications  . ibuprofen (ADVIL,MOTRIN) 600 MG tablet    Sig: Take 600 mg by mouth every 6 (six) hours as needed.    Refill:  0    Followup: 2  Months - but without point we will have her chart from [employer]. Herbie Baltimore, M.D., M.S. [facility]

## 2014-04-23 NOTE — Assessment & Plan Note (Signed)
Cannot be certain that she has dyspnea from heart failure or baseline lung disease. Currently does not a major issue for her, possibly because she just not very active.  2-D echocardiogram will help assess if there is any systolic or diastolic heart failure component versus possible persistent Effusion.

## 2014-04-23 NOTE — Assessment & Plan Note (Signed)
We discussed importance of dietary modification and exercise. Since she is not working, she should have the time to be a get basic exercise such as walking. She would like to see results the echocardiogram before starting down this road.

## 2014-04-23 NOTE — Assessment & Plan Note (Signed)
Listed as a possible diagnosis. She is on carvedilol and ARB. Since she has not had an echo in over a year I think is not unreasonable to go ahead with an echocardiogram now to get a new baseline for her. No active heart failure symptoms requiring diuretic.

## 2014-04-23 NOTE — Assessment & Plan Note (Signed)
Most likely was in the setting of a pneumonia, therefore parapneumonic. However she does have a history of positive ANA titers.  Plan: Check baseline echocardiogram just to ensure no recurrent effusions. We'll also assess EF.

## 2014-04-23 NOTE — Assessment & Plan Note (Signed)
Relatively well-controlled on ARB and carvedilol. Her pressure is low but higher today, but she is under a great deal of stress.

## 2014-04-24 ENCOUNTER — Emergency Department (HOSPITAL_COMMUNITY)
Admission: EM | Admit: 2014-04-24 | Discharge: 2014-04-24 | Disposition: A | Discharge status: Home / Self Care | Payer: Medicaid Other | Attending: Emergency Medicine | Admitting: Emergency Medicine

## 2014-04-24 ENCOUNTER — Encounter (HOSPITAL_COMMUNITY): Payer: Self-pay | Admitting: Emergency Medicine

## 2014-04-24 ENCOUNTER — Emergency Department (HOSPITAL_COMMUNITY): Payer: Medicaid Other

## 2014-04-24 ENCOUNTER — Telehealth: Payer: Self-pay | Admitting: Cardiology

## 2014-04-24 DIAGNOSIS — I10 Essential (primary) hypertension: Secondary | ICD-10-CM | POA: Diagnosis not present

## 2014-04-24 DIAGNOSIS — E039 Hypothyroidism, unspecified: Secondary | ICD-10-CM | POA: Insufficient documentation

## 2014-04-24 DIAGNOSIS — Z7951 Long term (current) use of inhaled steroids: Secondary | ICD-10-CM | POA: Diagnosis not present

## 2014-04-24 DIAGNOSIS — J3489 Other specified disorders of nose and nasal sinuses: Secondary | ICD-10-CM | POA: Insufficient documentation

## 2014-04-24 DIAGNOSIS — Z7982 Long term (current) use of aspirin: Secondary | ICD-10-CM | POA: Diagnosis not present

## 2014-04-24 DIAGNOSIS — Z8701 Personal history of pneumonia (recurrent): Secondary | ICD-10-CM | POA: Insufficient documentation

## 2014-04-24 DIAGNOSIS — R0981 Nasal congestion: Secondary | ICD-10-CM | POA: Diagnosis not present

## 2014-04-24 DIAGNOSIS — J45909 Unspecified asthma, uncomplicated: Secondary | ICD-10-CM | POA: Insufficient documentation

## 2014-04-24 DIAGNOSIS — R059 Cough, unspecified: Secondary | ICD-10-CM

## 2014-04-24 DIAGNOSIS — Z79899 Other long term (current) drug therapy: Secondary | ICD-10-CM | POA: Diagnosis not present

## 2014-04-24 DIAGNOSIS — Z87891 Personal history of nicotine dependence: Secondary | ICD-10-CM | POA: Diagnosis not present

## 2014-04-24 DIAGNOSIS — R05 Cough: Secondary | ICD-10-CM | POA: Diagnosis not present

## 2014-04-24 NOTE — Telephone Encounter (Signed)
Faxed signed release of information to [facility] in [place] per Dr Erich MontaneHardings request for records.  Faxed on 04/24/14. lp

## 2014-04-24 NOTE — ED Notes (Signed)
Pt states she has congestion building up in her chest and she is blowing her nose a lot  Pt states her sxs started 5 days ago

## 2014-04-24 NOTE — Discharge Instructions (Signed)
Please follow-up with your primary care physician. Return to the ED for new or worsening symptoms.

## 2014-04-24 NOTE — ED Notes (Signed)
Patient transported to X-ray 

## 2014-04-24 NOTE — ED Notes (Addendum)
Upon Discharge, Pt was upset and stating that the providers never came to talk to her, prior to setting her disposition.  Primary RN asked this Charge RN to speak w/ the Pt.  This Consulting civil engineerCharge RN apologized to the Pt and asked if she had any questions that we could address.  Pt was educated that she was diagnosed w/ nasal congestion and cough, it would need to run it's course, and she could talk to a pharmacist about appropriate OTC medications.  Pt verbalized understanding, but stated that she would be contacting administration concerning her provider complaints.  This Charge RN assured her that I would share her concerns with department leadership and the physician team.

## 2014-04-24 NOTE — ED Provider Notes (Signed)
CSN: [id number]     Arrival date & time 04/24/14  0507 History   First MD Initiated Contact with Patient 04/24/14 [phone]     Chief Complaint  Patient presents with  . Nasal Congestion     (Consider location/radiation/quality/duration/timing/severity/associated sxs/prior Treatment) The history is provided by the patient and medical records.    This is a 53 year old female with past medical history significant for hypertension, hypothyroidism, asthma, presenting to the ED for cough and nasal congestion. Patient states symptoms initially started 5 days ago and have been progressively worsening. She notes clear rhinorrhea which she states "is traveling into her chest". States she has a nonproductive cough, continues feeling congestion in her chest. She denies any chest pain or shortness of breath. No fever or chills. No known sick contacts. No intervention tried prior to arrival.  Past Medical History  Diagnosis Date  . Essential hypertension   . Idiopathic pericardial effusion     Uncertain etiology; was in setting of PNA has also been evaluated for inflammatory arthritis.;   . Thyroid disease     HYPOTHYROID  . History of pneumonia   . Reactive airway disease     No diagnosis listed, but on albuterol and Symbicort   Past Surgical History  Procedure Laterality Date  . Pericardiocentesis  2012    Likely parapneumonic   Family History  Problem Relation Age of Onset  . Diabetes Other   . Hypertension Other    History  Substance Use Topics  . Smoking status: Former Games developermoker  . Smokeless tobacco: Not on file     Comment: SMOKED IN HIGH SCHOOL  . Alcohol Use: Yes     Comment: WINE OCCASIONALLY   OB History    No data available     Review of Systems  HENT: Positive for congestion and rhinorrhea.   Respiratory: Positive for cough.   All other systems reviewed and are negative.     Allergies  Aliskiren; Amlodipine; Lisinopril; and Lithium  Home Medications   Prior to  Admission medications   Medication Sig Start Date End Date Taking? Authorizing Provider  albuterol (PROAIR HFA) 108 (90 BASE) MCG/ACT inhaler Inhale 2 puffs into the lungs. 06/28/13   Historical Provider, MD  aspirin EC 81 MG tablet Take 81 mg by mouth every morning.    Historical Provider, MD  budesonide-formoterol (SYMBICORT) 160-4.5 MCG/ACT inhaler Inhale 2 puffs into the lungs. 06/28/13   Historical Provider, MD  carvedilol (COREG) 3.125 MG tablet Take 3.125 mg by mouth 2 (two) times daily.    Historical Provider, MD  citalopram (CELEXA) 20 MG tablet Take 20 mg by mouth daily.    Historical Provider, MD  fluticasone (FLONASE) 50 MCG/ACT nasal spray Place 2 sprays into both nostrils daily. 03/26/13   Hannah Muthersbaugh, PA-C  ibuprofen (ADVIL,MOTRIN) 600 MG tablet Take 600 mg by mouth every 6 (six) hours as needed. 04/08/14   Historical Provider, MD  levothyroxine (SYNTHROID, LEVOTHROID) 100 MCG tablet Take 100 mcg by mouth daily before breakfast.    Historical Provider, MD  losartan (COZAAR) 100 MG tablet Take 100 mg by mouth every morning.    Historical Provider, MD   BP 124/70 mmHg  Pulse 78  Temp(Src) 98 F (36.7 C) (Oral)  Resp 18  Ht 5\' 4"  (1.626 m)  Wt 180 lb (81.647 kg)  BMI 30.88 kg/m2  SpO2 97%   Physical Exam  Constitutional: She is oriented to person, place, and time. She appears well-developed and well-nourished. No distress.  HENT:  Head: Normocephalic and atraumatic.  Right Ear: Tympanic membrane and ear canal normal.  Left Ear: Tympanic membrane and ear canal normal.  Nose: Mucosal edema and rhinorrhea present.  Mouth/Throat: Uvula is midline, oropharynx is clear and moist and mucous membranes are normal. No oropharyngeal exudate, posterior oropharyngeal edema, posterior oropharyngeal erythema or tonsillar abscesses.  Eyes: Conjunctivae and EOM are normal. Pupils are equal, round, and reactive to light.  Neck: Normal range of motion. Neck supple.  Cardiovascular:  Normal rate, regular rhythm and normal heart sounds.   Pulmonary/Chest: Effort normal and breath sounds normal. No respiratory distress. She has no wheezes. She has no rhonchi. She has no rales.  Musculoskeletal: Normal range of motion.  Neurological: She is alert and oriented to person, place, and time.  Skin: Skin is warm and dry. She is not diaphoretic.  Psychiatric: She has a normal mood and affect.  Nursing note and vitals reviewed.   ED Course  Procedures (including critical care time) Labs Review Labs Reviewed - No data to display  Imaging Review No results found.   EKG Interpretation None      MDM   Final diagnoses:  Cough  Nasal congestion   53 y.o. F with cough and nasal congestion.  No chest pain or SOB.  Patient afebrile and non-toxic, VS stable on RA.  CXR will be obtained.  6:11 AM CXR pending.  Care signed out to Dr. Read DriversMolpus.  Will follow results and dispo accordingly.  Garlon HatchetLisa M Slate Debroux, PA-C 04/24/14 [id number]  Suzanne SeamenJohn L Molpus, MD 04/24/14 [phone]

## 2014-04-26 ENCOUNTER — Emergency Department (HOSPITAL_COMMUNITY)
Admission: EM | Admit: 2014-04-26 | Discharge: 2014-04-26 | Disposition: A | Discharge status: Home / Self Care | Payer: Medicaid Other | Attending: Emergency Medicine | Admitting: Emergency Medicine

## 2014-04-26 ENCOUNTER — Encounter (HOSPITAL_COMMUNITY): Payer: Self-pay | Admitting: Oncology

## 2014-04-26 DIAGNOSIS — J45909 Unspecified asthma, uncomplicated: Secondary | ICD-10-CM | POA: Insufficient documentation

## 2014-04-26 DIAGNOSIS — Z8701 Personal history of pneumonia (recurrent): Secondary | ICD-10-CM | POA: Diagnosis not present

## 2014-04-26 DIAGNOSIS — Z7951 Long term (current) use of inhaled steroids: Secondary | ICD-10-CM | POA: Insufficient documentation

## 2014-04-26 DIAGNOSIS — I10 Essential (primary) hypertension: Secondary | ICD-10-CM | POA: Insufficient documentation

## 2014-04-26 DIAGNOSIS — B9789 Other viral agents as the cause of diseases classified elsewhere: Secondary | ICD-10-CM

## 2014-04-26 DIAGNOSIS — J069 Acute upper respiratory infection, unspecified: Secondary | ICD-10-CM | POA: Insufficient documentation

## 2014-04-26 DIAGNOSIS — Z8679 Personal history of other diseases of the circulatory system: Secondary | ICD-10-CM | POA: Diagnosis not present

## 2014-04-26 DIAGNOSIS — Z7982 Long term (current) use of aspirin: Secondary | ICD-10-CM | POA: Insufficient documentation

## 2014-04-26 DIAGNOSIS — Z79899 Other long term (current) drug therapy: Secondary | ICD-10-CM | POA: Insufficient documentation

## 2014-04-26 DIAGNOSIS — Z87891 Personal history of nicotine dependence: Secondary | ICD-10-CM | POA: Diagnosis not present

## 2014-04-26 DIAGNOSIS — R05 Cough: Secondary | ICD-10-CM | POA: Diagnosis present

## 2014-04-26 HISTORY — DX: Nonrheumatic mitral (valve) insufficiency: I34.0

## 2014-04-26 LAB — CBC WITH DIFFERENTIAL/PLATELET
Basophils Absolute: 0 10*3/uL (ref 0.0–0.1)
Basophils Relative: 1 % (ref 0–1)
Eosinophils Absolute: 0.2 10*3/uL (ref 0.0–0.7)
Eosinophils Relative: 5 % (ref 0–5)
HEMATOCRIT: 43.2 % (ref 36.0–46.0)
HEMOGLOBIN: 14.1 g/dL (ref 12.0–15.0)
Lymphocytes Relative: 34 % (ref 12–46)
Lymphs Abs: 1.6 10*3/uL (ref 0.7–4.0)
MCH: 31.7 pg (ref 26.0–34.0)
MCHC: 32.6 g/dL (ref 30.0–36.0)
MCV: 97.1 fL (ref 78.0–100.0)
MONO ABS: 0.3 10*3/uL (ref 0.1–1.0)
Monocytes Relative: 7 % (ref 3–12)
NEUTROS ABS: 2.4 10*3/uL (ref 1.7–7.7)
Neutrophils Relative %: 53 % (ref 43–77)
Platelets: 196 10*3/uL (ref 150–400)
RBC: 4.45 MIL/uL (ref 3.87–5.11)
RDW: 13.6 % (ref 11.5–15.5)
WBC: 4.5 10*3/uL (ref 4.0–10.5)

## 2014-04-26 LAB — I-STAT TROPONIN, ED: Troponin i, poc: 0 ng/mL (ref 0.00–0.08)

## 2014-04-26 LAB — BASIC METABOLIC PANEL
ANION GAP: 7 (ref 5–15)
BUN: 20 mg/dL (ref 6–23)
CALCIUM: 9.5 mg/dL (ref 8.4–10.5)
CO2: 25 mmol/L (ref 19–32)
Chloride: 106 mEq/L (ref 96–112)
Creatinine, Ser: 1.25 mg/dL — ABNORMAL HIGH (ref 0.50–1.10)
GFR calc Af Amer: 55 mL/min — ABNORMAL LOW (ref >90)
GFR calc non Af Amer: 48 mL/min — ABNORMAL LOW (ref >90)
Glucose, Bld: 94 mg/dL (ref 70–99)
Potassium: 3.8 mmol/L (ref 3.5–5.1)
SODIUM: 138 mmol/L (ref 135–145)

## 2014-04-26 LAB — BRAIN NATRIURETIC PEPTIDE: B Natriuretic Peptide: 21.7 pg/mL (ref 0.0–100.0)

## 2014-04-26 MED ORDER — GUAIFENESIN 100 MG/5ML PO LIQD: 100.0000 mg | ORAL | Status: DC | PRN

## 2014-04-26 MED ORDER — FLUTICASONE PROPIONATE 50 MCG/ACT NA SUSP: 2.0000 | Freq: Every day | NASAL | Status: AC

## 2014-04-26 MED ORDER — CETIRIZINE HCL 10 MG PO CAPS: 10.0000 mg | ORAL_CAPSULE | Freq: Every day | ORAL | Status: AC

## 2014-04-26 NOTE — Discharge Instructions (Signed)
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
## 2014-04-26 NOTE — ED Notes (Signed)
Pt presents d/t cough and congestion.  Pt was seen here yesterday for the same.  Pt reports not being able to sleep d/t the cough.

## 2014-04-28 ENCOUNTER — Telehealth (HOSPITAL_COMMUNITY): Payer: Self-pay | Admitting: *Deleted

## 2014-05-13 ENCOUNTER — Telehealth (HOSPITAL_COMMUNITY): Payer: Self-pay | Admitting: *Deleted

## 2014-05-20 NOTE — Telephone Encounter (Signed)
Called [facility] to find out status of records we requested on 05/08/14.  Was asked to refax release Attn Alona BeneJoyce.  Refaxed Release on 05/20/14. lp

## 2014-06-05 ENCOUNTER — Telehealth: Payer: Self-pay | Admitting: Cardiology

## 2014-06-05 NOTE — Telephone Encounter (Signed)
[facility]

## 2014-07-09 ENCOUNTER — Inpatient Hospital Stay (HOSPITAL_COMMUNITY): Admission: RE | Admit: 2014-07-09 | Payer: Medicaid Other | Source: Ambulatory Visit

## 2014-07-23 ENCOUNTER — Ambulatory Visit (HOSPITAL_COMMUNITY): Payer: Medicaid Other

## 2014-07-25 ENCOUNTER — Inpatient Hospital Stay (HOSPITAL_COMMUNITY): Admission: RE | Admit: 2014-07-25 | Payer: Medicaid Other | Source: Ambulatory Visit

## 2014-08-01 HISTORY — PX: TRANSTHORACIC ECHOCARDIOGRAM: SHX275

## 2014-08-04 ENCOUNTER — Ambulatory Visit (HOSPITAL_COMMUNITY)
Admission: RE | Admit: 2014-08-04 | Discharge: 2014-08-04 | Disposition: A | Discharge status: Home / Self Care | Payer: Medicaid Other | Source: Ambulatory Visit | Attending: Cardiology | Admitting: Cardiology

## 2014-08-04 DIAGNOSIS — I319 Disease of pericardium, unspecified: Secondary | ICD-10-CM | POA: Insufficient documentation

## 2014-08-04 DIAGNOSIS — R0600 Dyspnea, unspecified: Secondary | ICD-10-CM

## 2014-08-04 DIAGNOSIS — I3139 Other pericardial effusion (noninflammatory): Secondary | ICD-10-CM

## 2014-08-04 DIAGNOSIS — I313 Pericardial effusion (noninflammatory): Secondary | ICD-10-CM

## 2014-08-04 NOTE — Progress Notes (Signed)
Quick Note:  Echo results: Good news: Essentially normal echocardiogram and normal pump function and normal valve function. No signs to suggest heart attack. EF: 60-65%. No residual effusion.  NO signs of CHF>   ______

## 2014-08-04 NOTE — Progress Notes (Signed)
2D Echocardiogram Complete.  08/04/2014   Quantia Grullon, RDCS

## 2014-08-05 ENCOUNTER — Telehealth: Payer: Self-pay | Admitting: *Deleted

## 2014-08-05 NOTE — Telephone Encounter (Signed)
Spoke to patient. Result given . Verbalized understanding  

## 2014-08-05 NOTE — Telephone Encounter (Signed)
-----   Message from Marykay Lexavid W Harding, MD sent at 08/04/2014  6:11 PM EDT ----- Echo results: Good news: Essentially normal echocardiogram and normal pump function and normal valve function.  No signs to suggest heart attack. EF: 60-65%. No residual effusion.  NO signs of CHF>

## 2014-10-07 ENCOUNTER — Telehealth: Payer: Self-pay | Admitting: Cardiology

## 2014-10-07 NOTE — Telephone Encounter (Signed)
Pt. Informed that it was ok to take the med as given from the pharmacy

## 2014-10-07 NOTE — Telephone Encounter (Signed)
Follow up       Pt calling back to talk to nurse about a change in her potassium pill

## 2014-10-07 NOTE — Telephone Encounter (Signed)
Pt is calling in asking if Dr. Herbie BaltimoreHarding knew of a PCP she could be referred to in the [place] area. Please call  Thanks

## 2014-10-07 NOTE — Telephone Encounter (Signed)
Pt called in stating that the pharmacy changed the manufacture of her Losartan pill and she just wanted to make sure that it was safe to take. Please call  Thanks

## 2014-10-09 NOTE — Telephone Encounter (Signed)
Informed patient . Information mailed to patient- internal medicine physician and extenders. Patient can call her case manager for a list of medicaid providers Patient verbalized understanding.

## 2014-10-09 NOTE — Telephone Encounter (Signed)
Spoke to patient She would a list of physician  Office who take MEDICAID RN informed patient she will be able to give her list of  Office ,but not able to know who takes medicaid Will notify patient when mailing her a list.

## 2015-03-01 ENCOUNTER — Encounter (HOSPITAL_COMMUNITY): Payer: Self-pay | Admitting: *Deleted

## 2015-03-01 ENCOUNTER — Emergency Department (HOSPITAL_COMMUNITY)
Admission: EM | Admit: 2015-03-01 | Discharge: 2015-03-01 | Disposition: A | Discharge status: Home / Self Care | Payer: Medicaid Other | Attending: Emergency Medicine | Admitting: Emergency Medicine

## 2015-03-01 DIAGNOSIS — Z87891 Personal history of nicotine dependence: Secondary | ICD-10-CM | POA: Insufficient documentation

## 2015-03-01 DIAGNOSIS — Z8639 Personal history of other endocrine, nutritional and metabolic disease: Secondary | ICD-10-CM | POA: Insufficient documentation

## 2015-03-01 DIAGNOSIS — Z79899 Other long term (current) drug therapy: Secondary | ICD-10-CM | POA: Diagnosis not present

## 2015-03-01 DIAGNOSIS — I252 Old myocardial infarction: Secondary | ICD-10-CM | POA: Diagnosis not present

## 2015-03-01 DIAGNOSIS — K0889 Other specified disorders of teeth and supporting structures: Secondary | ICD-10-CM

## 2015-03-01 DIAGNOSIS — Z7982 Long term (current) use of aspirin: Secondary | ICD-10-CM | POA: Diagnosis not present

## 2015-03-01 DIAGNOSIS — Z8701 Personal history of pneumonia (recurrent): Secondary | ICD-10-CM | POA: Diagnosis not present

## 2015-03-01 DIAGNOSIS — I10 Essential (primary) hypertension: Secondary | ICD-10-CM | POA: Diagnosis not present

## 2015-03-01 MED ORDER — ACETAMINOPHEN 325 MG PO TABS
650.0000 mg | ORAL_TABLET | Freq: Once | ORAL | Status: AC
  Administered 2015-03-01: 650 mg via ORAL
  Filled 2015-03-01: qty 2

## 2015-03-01 MED ORDER — PENICILLIN V POTASSIUM 500 MG PO TABS: 500.0000 mg | ORAL_TABLET | Freq: Four times a day (QID) | ORAL | Status: AC

## 2015-03-01 MED ORDER — ACETAMINOPHEN 500 MG PO TABS: 500.0000 mg | ORAL_TABLET | Freq: Four times a day (QID) | ORAL | Status: DC | PRN

## 2015-03-01 NOTE — ED Notes (Signed)
PT reports pain and swelling to RT upper side of mouth.

## 2015-03-01 NOTE — ED Provider Notes (Signed)
CSN: [id number]     Arrival date & time 03/01/15  1724 History  By signing my name below, I, Elon SpannerGarrett Cook, attest that this documentation has been prepared under the direction and in the presence of Cheri FowlerKayla Marybel Alcott, PA-C. Electronically Signed: Elon SpannerGarrett Cook ED Scribe. 03/01/2015. 6:07 PM.    Chief Complaint  Patient presents with  . Dental Pain   The history is provided by the patient. No language interpreter was used.   HPI Comments: Suzanne Zuniga is a 54 y.o. female who presents to the Emergency Department complaining of constant, moderate, worsening right upper upper dental pain and swelling onset yesterday; unrelieved with ibuprofen. The patient reports she has an extraction scheduled 11/11 with a new dentist.  She denies drooling, trouble swallowing, SOB, fever, other complaints.     Past Medical History  Diagnosis Date  . Essential hypertension   . Idiopathic pericardial effusion     Uncertain etiology; was in setting of PNA has also been evaluated for inflammatory arthritis.;   . Thyroid disease     HYPOTHYROID  . History of pneumonia   . Reactive airway disease     No diagnosis listed, but on albuterol and Symbicort  . MI (mitral incompetence) 2012   Past Surgical History  Procedure Laterality Date  . Pericardiocentesis  2012    Likely parapneumonic   Family History  Problem Relation Age of Onset  . Diabetes Other   . Hypertension Other    Social History  Substance Use Topics  . Smoking status: Former Games developermoker  . Smokeless tobacco: None     Comment: SMOKED IN HIGH SCHOOL  . Alcohol Use: Yes     Comment: WINE OCCASIONALLY   OB History    No data available     Review of Systems A complete 10 system review of systems was obtained and all systems are negative except as noted in the HPI and PMH.    Allergies  Aliskiren; Amlodipine; Lisinopril; and Lithium  Home Medications   Prior to Admission medications   Medication Sig Start Date End Date Taking? Authorizing  Provider  albuterol (PROAIR HFA) 108 (90 BASE) MCG/ACT inhaler Inhale 2 puffs into the lungs every 4 (four) hours as needed for shortness of breath.  06/28/13   Historical Provider, MD  aspirin EC 81 MG tablet Take 81 mg by mouth every morning.    Historical Provider, MD  budesonide-formoterol (SYMBICORT) 160-4.5 MCG/ACT inhaler Inhale 2 puffs into the lungs daily.  06/28/13   Historical Provider, MD  carvedilol (COREG) 3.125 MG tablet Take 3.125 mg by mouth 2 (two) times daily.    Historical Provider, MD  Cetirizine HCl 10 MG CAPS Take 1 capsule (10 mg total) by mouth at bedtime. 04/26/14   Courtney Forcucci, PA-C  citalopram (CELEXA) 20 MG tablet Take 20 mg by mouth daily.    Historical Provider, MD  docusate sodium (COLACE) 100 MG capsule Take 100 mg by mouth daily.    Historical Provider, MD  fluticasone (FLONASE) 50 MCG/ACT nasal spray Place 2 sprays into both nostrils daily. 04/26/14   Courtney Forcucci, PA-C  guaiFENesin (ROBITUSSIN) 100 MG/5ML liquid Take 5-10 mLs (100-200 mg total) by mouth every 4 (four) hours as needed for cough. 04/26/14   Courtney Forcucci, PA-C  ibuprofen (ADVIL,MOTRIN) 600 MG tablet Take 600 mg by mouth every 6 (six) hours as needed for moderate pain.  04/08/14   Historical Provider, MD  losartan (COZAAR) 100 MG tablet Take 100 mg by mouth every morning.  Historical Provider, MD   BP 168/77 mmHg  Pulse 62  Temp(Src) 97.9 F (36.6 C) (Oral)  Resp 18  Ht  (1.651 m)  Wt 180 lb (81.647 kg)  BMI 29.95 kg/m2  SpO2 100% Physical Exam  Constitutional: She is oriented to person, place, and time. She appears well-developed and well-nourished. No distress.  HENT:  Head: Normocephalic and atraumatic.  Mouth/Throat: Oropharynx is clear and moist and mucous membranes are normal. She does not have dentures. No trismus in the jaw. Abnormal dentition (right upper 1st molar and surrounding gingiva TTP.  No erythema or drainage. Filling intact.). Dental caries present. No  uvula swelling.    Eyes: Conjunctivae and EOM are normal.  Neck: Normal range of motion. Neck supple. No tracheal deviation present.  No Ludwigs angina.  Cardiovascular: Normal rate and normal heart sounds.   Pulmonary/Chest: Effort normal and breath sounds normal. No respiratory distress. She has no wheezes. She has no rales.  Abdominal: Soft. Bowel sounds are normal. She exhibits no distension.  Musculoskeletal: Normal range of motion.  Lymphadenopathy:    She has no cervical adenopathy.  Neurological: She is alert and oriented to person, place, and time.  Skin: Skin is warm and dry.  Psychiatric: She has a normal mood and affect. Her behavior is normal.  Nursing note and vitals reviewed.   ED Course  Procedures (including critical care time)  DIAGNOSTIC STUDIES: Oxygen Saturation is 100% on RA, normal by my interpretation.    COORDINATION OF CARE:  5:53 PM Will prescribe tylenol, antibiotic, and topical numbing medication.  Patient advised to f/u at her scheduled dental appointment.  Patient acknowledges and agrees with plan.    Labs Review Labs Reviewed - No data to display  Imaging Review No results found. I have personally reviewed and evaluated these images and lab results as part of my medical decision-making.   EKG Interpretation None      MDM   Final diagnoses:  Pain, dental    Patient presents with right upper dental pain since yesterday unrelieved with ibuprofen. No fevers or chills. No drooling, or difficulty breathing. VSS, NAD. On exam her right upper first molar is tender to palpation. No surrounding drainage or erythema. Filling intact. No cervical lymphadenopathy, no Ludwig angina. Cannot rule out periapical abscess. Will treat empirically with Penicillin VK and Tylenol for pain management. Patient is advised to call her dentist to schedule an earlier appointment. Discussed return precautions. Patient agrees and acknowledges the above plan.  I  personally performed the services described in this documentation, which was scribed in my presence. The recorded information has been reviewed and is accurate.    Cheri Fowler, PA-C 03/01/15 1824  Mancel Bale, MD 03/01/15 [phone]

## 2015-03-01 NOTE — Discharge Instructions (Signed)

## 2015-03-01 NOTE — ED Notes (Signed)
Declined W/C at D/C and was escorted to lobby by RN. 

## 2015-10-30 ENCOUNTER — Emergency Department (HOSPITAL_COMMUNITY)
Admission: EM | Admit: 2015-10-30 | Discharge: 2015-10-30 | Disposition: A | Discharge status: Home / Self Care | Payer: Medicaid Other | Attending: Emergency Medicine | Admitting: Emergency Medicine

## 2015-10-30 ENCOUNTER — Encounter (HOSPITAL_COMMUNITY): Payer: Self-pay

## 2015-10-30 DIAGNOSIS — I10 Essential (primary) hypertension: Secondary | ICD-10-CM | POA: Insufficient documentation

## 2015-10-30 DIAGNOSIS — K0889 Other specified disorders of teeth and supporting structures: Secondary | ICD-10-CM | POA: Diagnosis present

## 2015-10-30 DIAGNOSIS — Z7982 Long term (current) use of aspirin: Secondary | ICD-10-CM | POA: Diagnosis not present

## 2015-10-30 DIAGNOSIS — K029 Dental caries, unspecified: Secondary | ICD-10-CM | POA: Insufficient documentation

## 2015-10-30 DIAGNOSIS — Z7951 Long term (current) use of inhaled steroids: Secondary | ICD-10-CM | POA: Diagnosis not present

## 2015-10-30 DIAGNOSIS — E039 Hypothyroidism, unspecified: Secondary | ICD-10-CM | POA: Diagnosis not present

## 2015-10-30 DIAGNOSIS — Z79899 Other long term (current) drug therapy: Secondary | ICD-10-CM | POA: Insufficient documentation

## 2015-10-30 DIAGNOSIS — Z87891 Personal history of nicotine dependence: Secondary | ICD-10-CM | POA: Diagnosis not present

## 2015-10-30 MED ORDER — OXYCODONE-ACETAMINOPHEN 5-325 MG PO TABS: 2.0000 | ORAL_TABLET | Freq: Once | ORAL | Status: DC

## 2015-10-30 MED ORDER — TRAMADOL HCL 50 MG PO TABS: 50.0000 mg | ORAL_TABLET | Freq: Four times a day (QID) | ORAL | Status: DC | PRN

## 2015-10-30 MED ORDER — NAPROXEN 375 MG PO TABS: 375.0000 mg | ORAL_TABLET | Freq: Two times a day (BID) | ORAL | Status: DC

## 2015-10-30 MED ORDER — AMOXICILLIN 500 MG PO CAPS: 500.0000 mg | ORAL_CAPSULE | Freq: Three times a day (TID) | ORAL | Status: DC

## 2015-10-30 NOTE — ED Provider Notes (Signed)
CSN: [id number]     Arrival date & time 10/30/15  0411 History   First MD Initiated Contact with Patient 10/30/15 [phone]     Chief Complaint  Patient presents with  . Dental Pain     (Consider location/radiation/quality/duration/timing/severity/associated sxs/prior Treatment) Patient is a 55 y.o. female presenting with tooth pain. The history is provided by the patient.  Dental Pain Location:  Upper Upper teeth location:  16/LU 3rd molar Quality:  Aching Severity:  Moderate Onset quality:  Gradual Duration:  3 weeks Timing:  Constant Progression:  Worsening Chronicity:  Recurrent Context: dental caries   Previous work-up:  Root canal (failed root canal - extraction on 11/02/2015) Relieved by:  None tried Worsened by:  Hot food/drink, cold food/drink and pressure Associated symptoms: no congestion, no difficulty swallowing, no drooling, no facial pain, no facial swelling, no fever, no gum swelling, no headaches, no neck pain, no neck swelling, no oral bleeding, no oral lesions and no trismus     Past Medical History  Diagnosis Date  . Essential hypertension   . Idiopathic pericardial effusion     Uncertain etiology; was in setting of PNA has also been evaluated for inflammatory arthritis.;   . Thyroid disease     HYPOTHYROID  . History of pneumonia   . Reactive airway disease     No diagnosis listed, but on albuterol and Symbicort  . MI (mitral incompetence) 2012   Past Surgical History  Procedure Laterality Date  . Pericardiocentesis  2012    Likely parapneumonic   Family History  Problem Relation Age of Onset  . Diabetes Other   . Hypertension Other    Social History  Substance Use Topics  . Smoking status: Former Games developermoker  . Smokeless tobacco: None     Comment: SMOKED IN HIGH SCHOOL  . Alcohol Use: No   OB History    No data available     Review of Systems  Constitutional: Negative for fever.  HENT: Negative for congestion, drooling, facial swelling, mouth  sores, trouble swallowing and voice change.   Musculoskeletal: Negative for neck pain.  Neurological: Negative for headaches.      Allergies  Aliskiren; Amlodipine; Lisinopril; and Lithium  Home Medications   Prior to Admission medications   Medication Sig Start Date End Date Taking? Authorizing Provider  albuterol (PROAIR HFA) 108 (90 BASE) MCG/ACT inhaler Inhale 2 puffs into the lungs every 4 (four) hours as needed for shortness of breath.  06/28/13  Yes Historical Provider, MD  aspirin EC 81 MG tablet Take 81 mg by mouth every morning.   Yes Historical Provider, MD  Cetirizine HCl 10 MG CAPS Take 1 capsule (10 mg total) by mouth at bedtime. 04/26/14  Yes Courtney Forcucci, PA-C  diltiazem (DILACOR XR) 180 MG 24 hr capsule Take 180 mg by mouth daily.   Yes Historical Provider, MD  ibuprofen (ADVIL,MOTRIN) 600 MG tablet Take 800 mg by mouth every 6 (six) hours as needed for moderate pain.  04/08/14  Yes Historical Provider, MD  losartan (COZAAR) 100 MG tablet Take 100 mg by mouth every morning.   Yes Historical Provider, MD  potassium chloride (K-DUR,KLOR-CON) 10 MEQ tablet Take 10 mEq by mouth daily.   Yes Historical Provider, MD  acetaminophen (TYLENOL) 500 MG tablet Take 1 tablet (500 mg total) by mouth every 6 (six) hours as needed. Patient not taking: Reported on 10/30/2015 03/01/15   Cheri FowlerKayla Rose, PA-C  amoxicillin (AMOXIL) 500 MG capsule Take 1 capsule (500 mg  total) by mouth 3 (three) times daily. 10/30/15   Arthor CaptainAbigail Eldor Conaway, PA-C  fluticasone (FLONASE) 50 MCG/ACT nasal spray Place 2 sprays into both nostrils daily. Patient not taking: Reported on 10/30/2015 04/26/14   Toni Amendourtney Forcucci, PA-C  guaiFENesin (ROBITUSSIN) 100 MG/5ML liquid Take 5-10 mLs (100-200 mg total) by mouth every 4 (four) hours as needed for cough. Patient not taking: Reported on 10/30/2015 04/26/14   Toni Amendourtney Forcucci, PA-C  naproxen (NAPROSYN) 375 MG tablet Take 1 tablet (375 mg total) by mouth 2 (two) times daily.  10/30/15   Arthor CaptainAbigail Maxmilian Trostel, PA-C  traMADol (ULTRAM) 50 MG tablet Take 1 tablet (50 mg total) by mouth every 6 (six) hours as needed. 10/30/15   Kataleyah Carducci, PA-C   BP 165/82 mmHg  Pulse 60  Temp(Src) 98 F (36.7 C) (Oral)  Resp 20  SpO2 100% Physical Exam  Constitutional: She is oriented to person, place, and time. She appears well-developed and well-nourished. No distress.  HENT:  Head: Normocephalic and atraumatic.  Mouth/Throat: Oropharynx is clear and moist.  Broken L upper molar.  With deep dental cary. No gum swelling or tenderness  Eyes: Conjunctivae are normal. No scleral icterus.  Neck: Normal range of motion.  Cardiovascular: Normal rate, regular rhythm and normal heart sounds.  Exam reveals no gallop and no friction rub.   No murmur heard. Pulmonary/Chest: Effort normal and breath sounds normal. No respiratory distress.  Abdominal: Soft. Bowel sounds are normal. She exhibits no distension and no mass. There is no tenderness. There is no guarding.  Neurological: She is alert and oriented to person, place, and time.  Skin: Skin is warm and dry. She is not diaphoretic.  Nursing note and vitals reviewed.   ED Course  Procedures (including critical care time) Labs Review Labs Reviewed - No data to display  Imaging Review No results found. I have personally reviewed and evaluated these images and lab results as part of my medical decision-making.   EKG Interpretation None      MDM   Final diagnoses:  Pain due to dental caries    Patient with toothache.  No gross abscess.  Exam unconcerning for Ludwig's angina or spread of infection.  Will treat with penicillin and pain medicine.  Urged patient to follow-up with dentist.       Arthor CaptainAbigail Zaray Gatchel, PA-C 10/30/15 [phone]  April Palumbo, MD 10/30/15 [phone]

## 2015-10-30 NOTE — ED Notes (Addendum)
Patient c/o left upper dental pain x2 days.  Patient states that has been unable to get to the dentist until her dental card came.  Patient stated that dental card came yesterday in the mail but, is unable to get into the dentist until Monday.  Patient states that the pain is from her #18 molar.  Patient states that she has been told that this molar should be extracted.  Patient also states that her blood pressure has been up and she has been having HA  Patient denies chest pain, denies SOB.  Patient states has been taking her blood pressure medications as prescribed and 2 days ago the medications were changed.  Patient rates pain 8/10.

## 2015-10-30 NOTE — Discharge Instructions (Signed)
You have been seen by your caregiver because of dental pain.  SEEK MEDICAL ATTENTION IF: The exam and treatment you received today has been provided on an emergency basis only. This is not a substitute for complete medical or dental care. If your problem worsens or new symptoms (problems) appear, and you are unable to arrange prompt follow-up care with your dentist, call or return to this location. CALL YOUR DENTIST OR RETURN IMMEDIATELY IF you develop a fever, rash, difficulty breathing or swallowing, neck or facial swelling, or other potentially serious concerns.   Dental Caries Dental caries (also called tooth decay) is the most common oral disease. It can occur at any age but is more common in children and young adults.  HOW DENTAL CARIES DEVELOPS  The process of decay begins when bacteria and foods (particularly sugars and starches) combine in your mouth to produce plaque. Plaque is a substance that sticks to the hard, outer surface of a tooth (enamel). The bacteria in plaque produce acids that attack enamel. These acids may also attack the root surface of a tooth (cementum) if it is exposed. Repeated attacks dissolve these surfaces and create holes in the tooth (cavities). If left untreated, the acids destroy the other layers of the tooth.  RISK FACTORS  Frequent sipping of sugary beverages.   Frequent snacking on sugary and starchy foods, especially those that easily get stuck in the teeth.   Poor oral hygiene.   Dry mouth.   Substance abuse such as methamphetamine abuse.   Broken or poor-fitting dental restorations.   Eating disorders.   Gastroesophageal reflux disease (GERD).   Certain radiation treatments to the head and neck. SYMPTOMS In the early stages of dental caries, symptoms are seldom present. Sometimes white, chalky areas may be seen on the enamel or other tooth layers. In later stages, symptoms may include:  Pits and holes on the enamel.  Toothache after  sweet, hot, or cold foods or drinks are consumed.  Pain around the tooth.  Swelling around the tooth. DIAGNOSIS  Most of the time, dental caries is detected during a regular dental checkup. A diagnosis is made after a thorough medical and dental history is taken and the surfaces of your teeth are checked for signs of dental caries. Sometimes special instruments, such as lasers, are used to check for dental caries. Dental X-ray exams may be taken so that areas not visible to the eye (such as between the contact areas of the teeth) can be checked for cavities.  TREATMENT  If dental caries is in its early stages, it may be reversed with a fluoride treatment or an application of a remineralizing agent at the dental office. Thorough brushing and flossing at home is needed to aid these treatments. If it is in its later stages, treatment depends on the location and extent of tooth destruction:   If a small area of the tooth has been destroyed, the destroyed area will be removed and cavities will be filled with a material such as gold, silver amalgam, or composite resin.   If a large area of the tooth has been destroyed, the destroyed area will be removed and a cap (crown) will be fitted over the remaining tooth structure.   If the center part of the tooth (pulp) is affected, a procedure called a root canal will be needed before a filling or crown can be placed.   If most of the tooth has been destroyed, the tooth may need to be pulled (extracted).  HOME CARE INSTRUCTIONS You can prevent, stop, or reverse dental caries at home by practicing good oral hygiene. Good oral hygiene includes:  Thoroughly cleaning your teeth at least twice a day with a toothbrush and dental floss.   Using a fluoride toothpaste. A fluoride mouth rinse may also be used if recommended by your dentist or health care provider.   Restricting the amount of sugary and starchy foods and sugary liquids you consume.   Avoiding  frequent snacking on these foods and sipping of these liquids.   Keeping regular visits with a dentist for checkups and cleanings. PREVENTION   Practice good oral hygiene.  Consider a dental sealant. A dental sealant is a coating material that is applied by your dentist to the pits and grooves of teeth. The sealant prevents food from being trapped in them. It may protect the teeth for several years.  Ask about fluoride supplements if you live in a community without fluorinated water or with water that has a low fluoride content. Use fluoride supplements as directed by your dentist or health care provider.  Allow fluoride varnish applications to teeth if directed by your dentist or health care provider.   This information is not intended to replace advice given to you by your health care provider. Make sure you discuss any questions you have with your health care provider.   Document Released: 01/08/2002 Document Revised: 05/09/2014 Document Reviewed: 04/20/2012 Elsevier Interactive Patient Education 2016 [employer]  Dental Pain Dental pain may be caused by many things, including:  Tooth decay (cavities or caries). Cavities expose the nerve of your tooth to air and hot or cold temperatures. This can cause pain or discomfort.  Abscess or infection. A dental abscess is a collection of infected pus from a bacterial infection in the inner part of the tooth (pulp). It usually occurs at the end of the tooth's root.  Injury.  An unknown reason (idiopathic). Your pain may be mild or severe. It may only occur when:  You are chewing.  You are exposed to hot or cold temperature.  You are eating or drinking sugary foods or beverages, such as soda or candy. Your pain may also be constant. HOME CARE INSTRUCTIONS Watch your dental pain for any changes. The following actions may help to lessen any discomfort that you are feeling:  Take medicines only as directed by your dentist.  If you  were prescribed an antibiotic medicine, finish all of it even if you start to feel better.  Keep all follow-up visits as directed by your dentist. This is important.  Do not apply heat to the outside of your face.  Rinse your mouth or gargle with salt water if directed by your dentist. This helps with pain and swelling.  You can make salt water by adding  tsp of salt to 1 cup of warm water.  Apply ice to the painful area of your face:  Put ice in a plastic bag.  Place a towel between your skin and the bag.  Leave the ice on for 20 minutes, 2-3 times per day.  Avoid foods or drinks that cause you pain, such as:  Very hot or very cold foods or drinks.  Sweet or sugary foods or drinks. SEEK MEDICAL CARE IF:  Your pain is not controlled with medicines.  Your symptoms are worse.  You have new symptoms. SEEK IMMEDIATE MEDICAL CARE IF:  You are unable to open your mouth.  You are having trouble breathing or swallowing.  You have a fever.  Your face, neck, or jaw is swollen.   This information is not intended to replace advice given to you by your health care provider. Make sure you discuss any questions you have with your health care provider.   Document Released: 04/18/2005 Document Revised: 09/02/2014 Document Reviewed: 04/14/2014 Elsevier Interactive Patient Education [employer]2016 [employer]

## 2015-11-12 ENCOUNTER — Other Ambulatory Visit: Payer: Self-pay | Admitting: Family Medicine

## 2015-11-12 DIAGNOSIS — R945 Abnormal results of liver function studies: Principal | ICD-10-CM

## 2015-11-12 DIAGNOSIS — R7989 Other specified abnormal findings of blood chemistry: Secondary | ICD-10-CM

## 2015-11-19 ENCOUNTER — Ambulatory Visit
Admission: RE | Admit: 2015-11-19 | Discharge: 2015-11-19 | Disposition: A | Discharge status: Home / Self Care | Payer: Medicaid Other | Source: Ambulatory Visit | Attending: Family Medicine | Admitting: Family Medicine

## 2015-11-19 DIAGNOSIS — R945 Abnormal results of liver function studies: Principal | ICD-10-CM

## 2015-11-19 DIAGNOSIS — R7989 Other specified abnormal findings of blood chemistry: Secondary | ICD-10-CM

## 2016-01-15 ENCOUNTER — Ambulatory Visit: Payer: Self-pay

## 2016-04-06 ENCOUNTER — Encounter: Payer: Self-pay | Admitting: Cardiology

## 2016-04-06 ENCOUNTER — Ambulatory Visit (INDEPENDENT_AMBULATORY_CARE_PROVIDER_SITE_OTHER): Payer: Medicaid Other | Admitting: Cardiology

## 2016-04-06 VITALS — BP 142/88 | HR 58 | Ht 65.0 in | Wt 200.0 lb

## 2016-04-06 DIAGNOSIS — I3139 Other pericardial effusion (noninflammatory): Secondary | ICD-10-CM

## 2016-04-06 DIAGNOSIS — I10 Essential (primary) hypertension: Secondary | ICD-10-CM | POA: Diagnosis not present

## 2016-04-06 DIAGNOSIS — I313 Pericardial effusion (noninflammatory): Secondary | ICD-10-CM

## 2016-04-06 DIAGNOSIS — R0609 Other forms of dyspnea: Secondary | ICD-10-CM

## 2016-04-06 DIAGNOSIS — R079 Chest pain, unspecified: Secondary | ICD-10-CM | POA: Diagnosis not present

## 2016-04-06 NOTE — Progress Notes (Signed)
PCP: Dois DavenportICHTER,KAREN L., MD  Clinic Note: Chief Complaint  Patient presents with  . Follow-up    pt c/o sob and chest pain     HPI: Suzanne Zuniga is a 55 y.o. female with a PMH below who presents today for Delayed follow-up. Complaints of chest pain or shortness of breath. She had a history of likely parapneumonic  Pleural effusion acting 2012 and had a pericardiocentesis. By report had a history of a "heart attack". No documented CAD. Postprocedure back, but she never did come back. We did not get records from her other hospitals.  Suzanne Zuniga was last seen on Back in December 2015. She noticed some exertional dyspnea, and was having some musculoskeletal pains. No further heart failure symptoms.  Recent Hospitalizations: No recent hospital stays  Studies Reviewed:   Echo from April 2016 showed normal EF of 60-65%. No regional wall motion analysis. No effusion.   Interval History:  Suzanne Zuniga presents today as a follow-up for concern for possibly "reaccumulating fluid around her heart ". Basically how she's been dealing with some allergy and cold like symptoms having recovered from the flu a few weeks ago. She was out and about with her friend or cousin who noted she had some puffiness around her eyebrows and cheeks. She was concerned this may be a sign of heart failure and felt that she needed to be evaluated. I haven't seen her in almost 2 years, and said that after I last saw her, she was actually starting to do relatively well going to the gym and doing lots of exercise. She took a break because she had a bout of flulike illness weeks. She is now in the process of recovering from that but she is now starting to try to get back into her exercise but not yet all the way back. The cough is improved as has some congestion, but she still has some dyspnea. Over last week or 2 she had some popping sensation in her chest that happened before or coughing and then cleared up with coughing. She has been  taking over-the-counter cough medicines and ended since it cleared up. No fevers or chills associated with it.  Since her flu cleared up - no more CP or SOB -- was noticing this with coughing related chest wall pain -- across the chest & lower ribs.    She is not having any PND or orthopnea. Maybe mild end of the day swelling but no real true edema. She denies any rapid irregular heartbeats or palpitations, sick 3/near syncope or TIA/amaurosis fugax.   No melena, hematochezia, hematuria, or epstaxis. No claudication.  She is, scattered historian very pressured speech. She describes having some poor sleep and feeling somewhat tired in the day but think this is all associated with recovering from her illness. For the flu she was going to the gym almost every day in and walking without any difficulty.  ROS: A comprehensive was performed. Review of Systems  Constitutional: Positive for malaise/fatigue (Energy level getting back up to baseline). Negative for chills and fever.  HENT: Positive for congestion (Clearing up). Negative for nosebleeds.   Respiratory: Positive for cough (Noticeably clearing up) and shortness of breath (Improved).   Cardiovascular: Negative for orthopnea and leg swelling.       Per history of present illness  Gastrointestinal:       GI symptoms from her flulike illness have now recovered.  Genitourinary: Negative for hematuria.  Musculoskeletal: Positive for myalgias (Improved with resolution of  her flu symptoms). Negative for joint pain.  Neurological: Positive for dizziness (With bending over).  Psychiatric/Behavioral: The patient is nervous/anxious and has insomnia.   All other systems reviewed and are negative.   Past Medical History:  Diagnosis Date  . CKD (chronic kidney disease) stage 3, GFR 30-59 ml/min   . Essential hypertension   . History of pneumonia   . HTN (hypertension)   . Idiopathic pericardial effusion    Uncertain etiology; was in setting of  PNA has also been evaluated for inflammatory arthritis.;   . MI (mitral incompetence) 2012  . Palpitations   . Reactive airway disease    No diagnosis listed, but on albuterol and Symbicort  . Thyroid disease    HYPOTHYROID    Past Surgical History:  Procedure Laterality Date  . PERICARDIOCENTESIS  2012   Likely parapneumonic  . TRANSTHORACIC ECHOCARDIOGRAM  08/2014   normal EF of 60-65%. No regional wall motion analysis. No effusion.(~6 months post pericardiocentesis)   Current Meds  Medication Sig  . aspirin EC 81 MG tablet Take 81 mg by mouth every morning.  . budesonide-formoterol (SYMBICORT) 160-4.5 MCG/ACT inhaler Inhale 2 puffs into the lungs 2 (two) times daily.  . Cetirizine HCl 10 MG CAPS Take 1 capsule (10 mg total) by mouth at bedtime.  Marland Kitchen. diltiazem (DILACOR XR) 180 MG 24 hr capsule Take 180 mg by mouth daily.  . fluticasone (FLONASE) 50 MCG/ACT nasal spray Place 2 sprays into both nostrils daily.  Marland Kitchen. ibuprofen (ADVIL,MOTRIN) 600 MG tablet Take 800 mg by mouth every 6 (six) hours as needed for moderate pain.   Marland Kitchen. losartan (COZAAR) 100 MG tablet Take 100 mg by mouth every morning.    Allergies  Allergen Reactions  . Aliskiren Itching and Rash  . Amlodipine Itching and Rash  . Lisinopril Itching and Rash  . Lithium Itching and Rash     Social History   Social History  . Marital status: Single    Spouse name: N/A  . Number of children: N/A  . Years of education: N/A   Social History Main Topics  . Smoking status: Former Games developermoker  . Smokeless tobacco: Never Used     Comment: SMOKED IN HIGH SCHOOL  . Alcohol use No  . Drug use: No  . Sexual activity: Not Asked   Other Topics Concern  . None   Social History Narrative   Single. Former smoker. Occasional alcohol.   Family History  Problem Relation Age of Onset  . Diabetes Other   . Hypertension Other     Wt Readings from Last 3 Encounters:  04/06/16 90.7 kg (200 lb)  03/01/15 81.6 kg (180 lb)    04/26/14 81.6 kg (180 lb)    PHYSICAL EXAM BP (!) 142/88 (BP Location: Right Arm, Patient Position: Sitting, Cuff Size: Normal)   Pulse (!) 58   Ht 5\' 5"  (1.651 m)   Wt 90.7 kg (200 lb)   SpO2 98%   BMI 33.28 kg/m  General appearance: alert, cooperative, appears stated age, no distress, mildly obese and Well-groomed Neck: no adenopathy, no carotid bruit, no JVD and supple, symmetrical, trachea midline Lungs: clear to auscultation bilaterally, normal percussion bilaterally and Nonlabored, good air movement Heart: regular rate and rhythm, S1& S2 normal, no murmur, click, rub or gallop and normal apical impulse Abdomen: soft, non-tender; bowel sounds normal; no masses,  no organomegaly Extremities: extremities normal, atraumatic, no cyanosis or edema Pulses: 2+ and symmetric Skin: Skin color, texture, turgor normal. No  rashes or lesions Neurologic: Alert and oriented X 3. Normal coordination and gait    Adult ECG Report  Rate: 58 ;  Rhythm: sinus bradycardia and CRO anterior MI, age undetermined (very non-specific finding) with subtle Lateral wall TWI.;   Narrative Interpretation: stable EKG from 2015   Other studies Reviewed: Additional studies/ records that were reviewed today include:  Recent Labs:  n/a     ASSESSMENT / PLAN: Problem List Items Addressed This Visit    Essential hypertension - Primary (Chronic)    Pressures on diltiazem and losartan. With resting heart 58, would not increase diltiazem. He is a little bit pressured today and stressed about being at the doctor's office. I'm inclined to simply leave this alone.      Relevant Orders   EKG 12-Lead   Idiopathic pericardial effusion (Chronic)    Probably related to a parapneumonic effusion in the setting of her acute illness. Was resolved on follow-up echocardiogram. No signs or symptoms to suggest recurrence with no significant heart failure symptoms.  Unlikely to recur unless there is a true stimulation. She  was concerned that maybe this can occur with the flu. I doubt that a subacute illness would cause a parapneumonic effusion      Relevant Orders   EKG 12-Lead   Chest pain with low risk for cardiac etiology    Probably muscular skeletal pain in the setting of a long episode with coughing. Herbie Baltimore seems to have resolved. Does not sound like pericarditis pain      Dyspnea    She really was doing fine prior to having the flu doing all kinds of exercise, and is now starting to get back into exercise. I think the dyspnea was really related to her having a flulike illness. She's not having any heart failure symptoms of PND orthopnea or edema to suggest recurrence of a cardiac etiology.         Current medicines are reviewed at length with the patient today. (+/- concerns) none The following changes have been made: none  Patient Instructions  NO CHANGES WITH CURRENT TIME   Your physician recommends that you schedule a follow-up appointment as needed.    Studies Ordered:   Orders Placed This Encounter  Procedures  . EKG 12-Lead      Bryan Lemma, M.D., M.S. Interventional Cardiologist   Pager # [phone] Phone # [phone] [address] [place], [place] [address]

## 2016-04-06 NOTE — Patient Instructions (Signed)
NO CHANGES WITH CURRENT TIME   Your physician recommends that you schedule a follow-up appointment as needed.

## 2016-04-08 ENCOUNTER — Encounter: Payer: Self-pay | Admitting: Cardiology

## 2016-04-08 DIAGNOSIS — R079 Chest pain, unspecified: Secondary | ICD-10-CM | POA: Insufficient documentation

## 2016-04-08 NOTE — Assessment & Plan Note (Signed)
Pressures on diltiazem and losartan. With resting heart 58, would not increase diltiazem. He is a little bit pressured today and stressed about being at the doctor's office. I'm inclined to simply leave this alone.

## 2016-04-08 NOTE — Assessment & Plan Note (Signed)
Probably muscular skeletal pain in the setting of a long episode with coughing. Herbie BaltimoreHarding seems to have resolved. Does not sound like pericarditis pain

## 2016-04-08 NOTE — Assessment & Plan Note (Signed)
Probably related to a parapneumonic effusion in the setting of her acute illness. Was resolved on follow-up echocardiogram. No signs or symptoms to suggest recurrence with no significant heart failure symptoms.  Unlikely to recur unless there is a true stimulation. She was concerned that maybe this can occur with the flu. I doubt that a subacute illness would cause a parapneumonic effusion

## 2016-04-08 NOTE — Assessment & Plan Note (Signed)
She really was doing fine prior to having the flu doing all kinds of exercise, and is now starting to get back into exercise. I think the dyspnea was really related to her having a flulike illness. She's not having any heart failure symptoms of PND orthopnea or edema to suggest recurrence of a cardiac etiology.

## 2016-05-30 ENCOUNTER — Ambulatory Visit: Payer: Self-pay | Admitting: Surgery

## 2017-02-16 ENCOUNTER — Emergency Department (HOSPITAL_COMMUNITY): Payer: Medicaid Other

## 2017-02-16 ENCOUNTER — Encounter (HOSPITAL_COMMUNITY): Payer: Self-pay | Admitting: Emergency Medicine

## 2017-02-16 DIAGNOSIS — S8991XA Unspecified injury of right lower leg, initial encounter: Secondary | ICD-10-CM | POA: Insufficient documentation

## 2017-02-16 DIAGNOSIS — I509 Heart failure, unspecified: Secondary | ICD-10-CM | POA: Diagnosis not present

## 2017-02-16 DIAGNOSIS — I252 Old myocardial infarction: Secondary | ICD-10-CM | POA: Insufficient documentation

## 2017-02-16 DIAGNOSIS — Y9301 Activity, walking, marching and hiking: Secondary | ICD-10-CM | POA: Diagnosis not present

## 2017-02-16 DIAGNOSIS — I110 Hypertensive heart disease with heart failure: Secondary | ICD-10-CM | POA: Insufficient documentation

## 2017-02-16 DIAGNOSIS — N183 Chronic kidney disease, stage 3 (moderate): Secondary | ICD-10-CM | POA: Diagnosis not present

## 2017-02-16 DIAGNOSIS — I2510 Atherosclerotic heart disease of native coronary artery without angina pectoris: Secondary | ICD-10-CM | POA: Insufficient documentation

## 2017-02-16 DIAGNOSIS — Y929 Unspecified place or not applicable: Secondary | ICD-10-CM | POA: Insufficient documentation

## 2017-02-16 DIAGNOSIS — Z79899 Other long term (current) drug therapy: Secondary | ICD-10-CM | POA: Insufficient documentation

## 2017-02-16 DIAGNOSIS — I129 Hypertensive chronic kidney disease with stage 1 through stage 4 chronic kidney disease, or unspecified chronic kidney disease: Secondary | ICD-10-CM | POA: Diagnosis not present

## 2017-02-16 DIAGNOSIS — Y998 Other external cause status: Secondary | ICD-10-CM | POA: Insufficient documentation

## 2017-02-16 DIAGNOSIS — W108XXA Fall (on) (from) other stairs and steps, initial encounter: Secondary | ICD-10-CM | POA: Diagnosis not present

## 2017-02-16 DIAGNOSIS — E039 Hypothyroidism, unspecified: Secondary | ICD-10-CM | POA: Diagnosis not present

## 2017-02-16 DIAGNOSIS — Z87891 Personal history of nicotine dependence: Secondary | ICD-10-CM | POA: Diagnosis not present

## 2017-02-16 NOTE — ED Triage Notes (Signed)
Pt states she fell down a flight of stairs yesterday  Pt states she was out of town  EMS came but she declined transport  Pt states she is unable to bear weight on her right leg  Pt has swelling to her right knee  Denies pain anywhere other than her right leg

## 2017-02-17 ENCOUNTER — Emergency Department (HOSPITAL_COMMUNITY)
Admission: EM | Admit: 2017-02-17 | Discharge: 2017-02-17 | Disposition: A | Discharge status: Home / Self Care | Payer: Medicaid Other | Attending: Emergency Medicine | Admitting: Emergency Medicine

## 2017-02-17 DIAGNOSIS — S8991XA Unspecified injury of right lower leg, initial encounter: Secondary | ICD-10-CM

## 2017-02-17 HISTORY — DX: Unspecified osteoarthritis, unspecified site: M19.90

## 2017-02-17 HISTORY — DX: Atherosclerotic heart disease of native coronary artery without angina pectoris: I25.10

## 2017-02-17 MED ORDER — HYDROCODONE-ACETAMINOPHEN 5-325 MG PO TABS: 1.0000 | ORAL_TABLET | ORAL | 0 refills | Status: AC | PRN

## 2017-02-17 MED ORDER — IBUPROFEN 600 MG PO TABS
600.0000 mg | ORAL_TABLET | Freq: Four times a day (QID) | ORAL | 0 refills | Status: AC | PRN
Start: 2017-02-17 — End: ?

## 2017-02-17 NOTE — ED Notes (Signed)
Pt complaining in lobby that her leg is hurting and she is tired of waiting  Pt brought to back, assisted to restroom  Placed in triage room so she could elevate her leg  Ice pack given

## 2017-02-17 NOTE — Discharge Instructions (Signed)
Ice and elevate the right leg to reduce swelling. Take ibuprofen for pain and swelling, and Norco if needed for severe pain. Use the knee immobilizer when active and weight bear as tolerated using the crutches. Follow up with Dr. Lequita HaltAluisio if pain and swelling are no better in 3-4 days.

## 2017-02-22 NOTE — ED Provider Notes (Signed)
[facility]-EMERGENCY DEPT Provider Note   CSN: [id number] Arrival date & time: 02/16/17  2202     History   Chief Complaint Chief Complaint  Patient presents with  . Fall    HPI Suzanne Zuniga is a 56 y.o. female.  Patient presents with knee pain after fall last evening walking down a flight of stairs. She was evaluated by EMS at the time but did not transport for full medical evaluation. She is here tonight with persistent right knee pain and swelling. It is difficult for her to bear weight on the right leg. She denies other injury or symptom. No wound.    The history is provided by the patient. No language interpreter was used.  Fall  Pertinent negatives include no chest pain, no abdominal pain and no headaches.    Past Medical History:  Diagnosis Date  . Arthritis   . CKD (chronic kidney disease) stage 3, GFR 30-59 ml/min (HCC)   . Coronary artery disease   . Essential hypertension   . History of pneumonia   . HTN (hypertension)   . Idiopathic pericardial effusion    Uncertain etiology; was in setting of PNA has also been evaluated for inflammatory arthritis.;   . MI (mitral incompetence) 2012  . Palpitations   . Reactive airway disease    No diagnosis listed, but on albuterol and Symbicort  . Thyroid disease    HYPOTHYROID    Patient Active Problem List   Diagnosis Date Noted  . Chest pain with low risk for cardiac etiology 04/08/2016  . Obesity (BMI 30-39.9) 04/23/2014    Class: Chronic  . Dyspnea 04/23/2014  . Chronic heart failure (HCC) 02/13/2014  . Essential hypertension   . Idiopathic pericardial effusion     Past Surgical History:  Procedure Laterality Date  . PERICARDIOCENTESIS  2012   Likely parapneumonic  . TRANSTHORACIC ECHOCARDIOGRAM  08/2014   normal EF of 60-65%. No regional wall motion analysis. No effusion.(~6 months post pericardiocentesis)    OB History    No data available       Home Medications    Prior  to Admission medications   Medication Sig Start Date End Date Taking? Authorizing Provider  aspirin EC 81 MG tablet Take 81 mg by mouth every morning.   Yes [provider]  budesonide-formoterol (SYMBICORT) 160-4.5 MCG/ACT inhaler Inhale 2 puffs into the lungs 2 (two) times daily.   Yes [provider]  Cetirizine HCl 10 MG CAPS Take 1 capsule (10 mg total) by mouth at bedtime. 04/26/14  Yes Forcucci, Courtney, PA-C  diltiazem (DILACOR XR) 180 MG 24 hr capsule Take 180 mg by mouth daily.   Yes [provider]  fluticasone (FLONASE) 50 MCG/ACT nasal spray Place 2 sprays into both nostrils daily. 04/26/14  Yes Forcucci, Courtney, PA-C  levothyroxine (SYNTHROID, LEVOTHROID) 100 MCG tablet Take 100 mcg by mouth daily.   Yes [provider]  losartan (COZAAR) 100 MG tablet Take 100 mg by mouth every morning.   Yes [provider]  HYDROcodone-acetaminophen (NORCO/VICODIN) 5-325 MG tablet Take 1 tablet by mouth every 4 (four) hours as needed. 02/17/17   Elpidio Anis, PA-C  ibuprofen (ADVIL,MOTRIN) 600 MG tablet Take 1 tablet (600 mg total) by mouth every 6 (six) hours as needed. 02/17/17   Elpidio Anis, PA-C    Family History Family History  Problem Relation Age of Onset  . Diabetes Other   . Hypertension Other     Social History Social  History  Substance Use Topics  . Smoking status: Former Games developermoker  . Smokeless tobacco: Never Used     Comment: SMOKED IN HIGH SCHOOL  . Alcohol use No     Allergies   Aliskiren; Amlodipine; Lisinopril; and Lithium   Review of Systems Review of Systems  Cardiovascular: Negative.  Negative for chest pain.  Gastrointestinal: Negative.  Negative for abdominal pain.  Musculoskeletal:       See HPI.  Skin: Negative.  Negative for wound.  Neurological: Negative.  Negative for numbness and headaches.     Physical Exam Updated Vital Signs BP 111/62 (BP Location: Left Arm)   Pulse 75   Temp 97.6 F (36.4  C) (Oral)   Resp 18   Ht 5\' 4"  (1.626 m)   Wt 83.9 kg (185 lb)   SpO2 98%   BMI 31.76 kg/m   Physical Exam  Constitutional: She is oriented to person, place, and time. She appears well-developed and well-nourished.  Neck: Normal range of motion.  Pulmonary/Chest: Effort normal.  Musculoskeletal:  Right knee is moderately swollen, greater on medial aspect than lateral. Joint stable. No bony deformity. No calf or thigh tenderness or swelling. Distal pulses are intact.   Neurological: She is alert and oriented to person, place, and time.  Skin: Skin is warm and dry.     ED Treatments / Results  Labs (all labs ordered are listed, but only abnormal results are displayed) Labs Reviewed - No data to display  EKG  EKG Interpretation None       Radiology No results found.  Procedures Procedures (including critical care time)  Medications Ordered in ED Medications - No data to display   Initial Impression / Assessment and Plan / ED Course  I have reviewed the triage vital signs and the nursing notes.  Pertinent labs & imaging results that were available during my care of the patient were reviewed by me and considered in my medical decision making (see chart for details).     Patient here for evaluation of knee injury occurring last night. Imaging is negative for fracture. No tendon/ligament laxity on exam limited by pain.   Knee immobilizer and crutches provided. Will refer to orthopedics if pain and swelling persist after conservative treatment.   Final Clinical Impressions(s) / ED Diagnoses   Final diagnoses:  Injury of right knee, initial encounter    New Prescriptions Discharge Medication List as of 02/17/2017  6:05 AM    START taking these medications   Details  HYDROcodone-acetaminophen (NORCO/VICODIN) 5-325 MG tablet Take 1 tablet by mouth every 4 (four) hours as needed., Starting Fri 02/17/2017, Print         Elpidio AnisUpstill, Lamaj Metoyer, PA-C 02/22/17 2113      Molpus, Jonny RuizJohn, MD 02/22/17 2239

## 2017-05-05 ENCOUNTER — Emergency Department (HOSPITAL_COMMUNITY)
Admission: EM | Admit: 2017-05-05 | Discharge: 2017-05-06 | Disposition: A | Discharge status: Home / Self Care | Payer: Medicaid Other | Attending: Emergency Medicine | Admitting: Emergency Medicine

## 2017-05-05 DIAGNOSIS — N3000 Acute cystitis without hematuria: Secondary | ICD-10-CM | POA: Insufficient documentation

## 2017-05-05 DIAGNOSIS — I2510 Atherosclerotic heart disease of native coronary artery without angina pectoris: Secondary | ICD-10-CM | POA: Diagnosis not present

## 2017-05-05 DIAGNOSIS — N183 Chronic kidney disease, stage 3 (moderate): Secondary | ICD-10-CM | POA: Insufficient documentation

## 2017-05-05 DIAGNOSIS — I129 Hypertensive chronic kidney disease with stage 1 through stage 4 chronic kidney disease, or unspecified chronic kidney disease: Secondary | ICD-10-CM | POA: Diagnosis not present

## 2017-05-05 DIAGNOSIS — R55 Syncope and collapse: Secondary | ICD-10-CM | POA: Diagnosis present

## 2017-05-05 DIAGNOSIS — Z87891 Personal history of nicotine dependence: Secondary | ICD-10-CM | POA: Diagnosis not present

## 2017-05-05 DIAGNOSIS — Z7982 Long term (current) use of aspirin: Secondary | ICD-10-CM | POA: Diagnosis not present

## 2017-05-05 DIAGNOSIS — E039 Hypothyroidism, unspecified: Secondary | ICD-10-CM | POA: Insufficient documentation

## 2017-05-05 DIAGNOSIS — J45909 Unspecified asthma, uncomplicated: Secondary | ICD-10-CM | POA: Insufficient documentation

## 2017-05-05 DIAGNOSIS — I252 Old myocardial infarction: Secondary | ICD-10-CM | POA: Insufficient documentation

## 2017-05-05 DIAGNOSIS — R42 Dizziness and giddiness: Secondary | ICD-10-CM

## 2017-05-06 ENCOUNTER — Other Ambulatory Visit: Payer: Self-pay

## 2017-05-06 ENCOUNTER — Encounter (HOSPITAL_COMMUNITY): Payer: Self-pay

## 2017-05-06 LAB — BASIC METABOLIC PANEL
ANION GAP: 7 (ref 5–15)
BUN: 13 mg/dL (ref 6–20)
CO2: 27 mmol/L (ref 22–32)
Calcium: 9.3 mg/dL (ref 8.9–10.3)
Chloride: 105 mmol/L (ref 101–111)
Creatinine, Ser: 0.94 mg/dL (ref 0.44–1.00)
GFR calc Af Amer: >60 mL/min (ref >60)
GLUCOSE: 140 mg/dL — AB (ref 65–99)
POTASSIUM: 3.6 mmol/L (ref 3.5–5.1)
SODIUM: 139 mmol/L (ref 135–145)

## 2017-05-06 LAB — CBC
HEMATOCRIT: 43.2 % (ref 36.0–46.0)
HEMOGLOBIN: 14.5 g/dL (ref 12.0–15.0)
MCH: 32.4 pg (ref 26.0–34.0)
MCHC: 33.6 g/dL (ref 30.0–36.0)
MCV: 96.4 fL (ref 78.0–100.0)
Platelets: 194 10*3/uL (ref 150–400)
RBC: 4.48 MIL/uL (ref 3.87–5.11)
RDW: 13.3 % (ref 11.5–15.5)
WBC: 4.1 10*3/uL (ref 4.0–10.5)

## 2017-05-06 LAB — URINALYSIS, ROUTINE W REFLEX MICROSCOPIC
BACTERIA UA: NONE SEEN
BILIRUBIN URINE: NEGATIVE
Glucose, UA: NEGATIVE mg/dL
Hgb urine dipstick: NEGATIVE
KETONES UR: NEGATIVE mg/dL
NITRITE: NEGATIVE
Protein, ur: NEGATIVE mg/dL
Specific Gravity, Urine: 1.008 (ref 1.005–1.030)
pH: 7 (ref 5.0–8.0)

## 2017-05-06 LAB — CBG MONITORING, ED: GLUCOSE-CAPILLARY: 144 mg/dL — AB (ref 65–99)

## 2017-05-06 LAB — I-STAT BETA HCG BLOOD, ED (MC, WL, AP ONLY)

## 2017-05-06 MED ORDER — CEPHALEXIN 500 MG PO CAPS: 500.0000 mg | ORAL_CAPSULE | Freq: Two times a day (BID) | ORAL | 0 refills | Status: AC

## 2017-05-06 MED ORDER — SODIUM CHLORIDE 0.9 % IV BOLUS (SEPSIS)
500.0000 mL | Freq: Once | INTRAVENOUS | Status: AC
  Administered 2017-05-06: 500 mL via INTRAVENOUS

## 2017-05-06 MED ORDER — CEPHALEXIN 250 MG PO CAPS
500.0000 mg | ORAL_CAPSULE | Freq: Once | ORAL | Status: AC
  Administered 2017-05-06: 500 mg via ORAL
  Filled 2017-05-06: qty 2

## 2017-05-06 NOTE — ED Triage Notes (Signed)
Per [facility], pt experienced sudden onset nausea and dizziness after dinner. Pt denies vomiting and pain. Pt pressure initially 202 systolic with history of HTN and denies missing medicine. Pt had negative stroke screen and no change in orthostatic blood pressure. Last pressure in truck was 170/92. Pt has 18 G in L AC and received 4 of zofran with no relief for nausea.

## 2017-05-06 NOTE — ED Provider Notes (Signed)
[facility] EMERGENCY DEPARTMENT Provider Note   CSN: [id number] Arrival date & time: 05/05/17  2348     History   Chief Complaint Chief Complaint  Patient presents with  . Near Syncope  . Nausea    HPI Suzanne Zuniga is a 57 y.o. female.  Patient presents to the ER for evaluation of dizziness.  She reports that she noticed onset of symptoms after eating dinner tonight.  She was walking down the hall and suddenly felt like she was falling to the side.  She had onset of nausea but has not had any vomiting.  She denies chest pain, heart palpitations, shortness of breath.  She has not had any abdominal pain or diarrhea.  She has not noticed any fever.  Patient reports that she was given Zofran by EMS and her symptoms have significantly improved.  Patient was hypertensive for EMS but this has slowly improved here in the ER without any intervention.      Past Medical History:  Diagnosis Date  . Arthritis   . CKD (chronic kidney disease) stage 3, GFR 30-59 ml/min (HCC)   . Coronary artery disease   . Essential hypertension   . History of pneumonia   . HTN (hypertension)   . Idiopathic pericardial effusion    Uncertain etiology; was in setting of PNA has also been evaluated for inflammatory arthritis.;   . MI (mitral incompetence) 2012  . Palpitations   . Reactive airway disease    No diagnosis listed, but on albuterol and Symbicort  . Thyroid disease    HYPOTHYROID    Patient Active Problem List   Diagnosis Date Noted  . Chest pain with low risk for cardiac etiology 04/08/2016  . Obesity (BMI 30-39.9) 04/23/2014    Class: Chronic  . Dyspnea 04/23/2014  . Chronic heart failure (HCC) 02/13/2014  . Essential hypertension   . Idiopathic pericardial effusion     Past Surgical History:  Procedure Laterality Date  . PERICARDIOCENTESIS  2012   Likely parapneumonic  . TRANSTHORACIC ECHOCARDIOGRAM  08/2014   normal EF of 60-65%. No regional wall motion  analysis. No effusion.(~6 months post pericardiocentesis)    OB History    No data available       Home Medications    Prior to Admission medications   Medication Sig Start Date End Date Taking? Authorizing Provider  aspirin EC 81 MG tablet Take 81 mg by mouth every morning.    [provider]  budesonide-formoterol (SYMBICORT) 160-4.5 MCG/ACT inhaler Inhale 2 puffs into the lungs 2 (two) times daily.    [provider]  cephALEXin (KEFLEX) 500 MG capsule Take 1 capsule (500 mg total) by mouth 2 (two) times daily. 05/06/17   Gilda Crease, MD  Cetirizine HCl 10 MG CAPS Take 1 capsule (10 mg total) by mouth at bedtime. 04/26/14   Forcucci, Courtney, PA-C  diltiazem (DILACOR XR) 180 MG 24 hr capsule Take 180 mg by mouth daily.    [provider]  fluticasone (FLONASE) 50 MCG/ACT nasal spray Place 2 sprays into both nostrils daily. 04/26/14   Forcucci, Courtney, PA-C  HYDROcodone-acetaminophen (NORCO/VICODIN) 5-325 MG tablet Take 1 tablet by mouth every 4 (four) hours as needed. 02/17/17   Elpidio Anis, PA-C  ibuprofen (ADVIL,MOTRIN) 600 MG tablet Take 1 tablet (600 mg total) by mouth every 6 (six) hours as needed. 02/17/17   Elpidio Anis, PA-C  levothyroxine (SYNTHROID, LEVOTHROID) 100 MCG tablet Take 100 mcg by mouth daily.  [provider]  losartan (COZAAR) 100 MG tablet Take 100 mg by mouth every morning.    [provider]    Family History Family History  Problem Relation Age of Onset  . Diabetes Other   . Hypertension Other     Social History Social History   Tobacco Use  . Smoking status: Former Games developermoker  . Smokeless tobacco: Never Used  . Tobacco comment: SMOKED IN HIGH SCHOOL  Substance Use Topics  . Alcohol use: No  . Drug use: No     Allergies   Aliskiren; Amlodipine; Lisinopril; and Lithium   Review of Systems Review of Systems  Gastrointestinal: Positive for nausea.  Genitourinary: Positive for  frequency.  Neurological: Positive for dizziness.  All other systems reviewed and are negative.    Physical Exam Updated Vital Signs BP 133/71   Pulse (!) 55   Temp (!) 97.5 F (36.4 C) (Oral)   Resp 15   Ht 5\' 4"  (1.626 m)   Wt 88.5 kg (195 lb)   SpO2 100%   BMI 33.47 kg/m   Physical Exam  Constitutional: She is oriented to person, place, and time. She appears well-developed and well-nourished. No distress.  HENT:  Head: Normocephalic and atraumatic.  Right Ear: Hearing normal.  Left Ear: Hearing normal.  Nose: Nose normal.  Mouth/Throat: Oropharynx is clear and moist and mucous membranes are normal.  Eyes: Conjunctivae and EOM are normal. Pupils are equal, round, and reactive to light.  Neck: Normal range of motion. Neck supple.  Cardiovascular: Regular rhythm, S1 normal and S2 normal. Exam reveals no gallop and no friction rub.  No murmur heard. Pulmonary/Chest: Effort normal and breath sounds normal. No respiratory distress. She exhibits no tenderness.  Abdominal: Soft. Normal appearance and bowel sounds are normal. There is no hepatosplenomegaly. There is no tenderness. There is no rebound, no guarding, no tenderness at McBurney's point and negative Murphy's sign. No hernia.  Musculoskeletal: Normal range of motion.  Neurological: She is alert and oriented to person, place, and time. She has normal strength. No cranial nerve deficit or sensory deficit. Coordination normal. GCS eye subscore is 4. GCS verbal subscore is 5. GCS motor subscore is 6.  Skin: Skin is warm, dry and intact. No rash noted. No cyanosis.  Psychiatric: She has a normal mood and affect. Her speech is normal and behavior is normal. Thought content normal.  Nursing note and vitals reviewed.    ED Treatments / Results  Labs (all labs ordered are listed, but only abnormal results are displayed) Labs Reviewed  BASIC METABOLIC PANEL - Abnormal; Notable for the following components:      Result Value    Glucose, Bld 140 (*)    All other components within normal limits  URINALYSIS, ROUTINE W REFLEX MICROSCOPIC - Abnormal; Notable for the following components:   Color, Urine STRAW (*)    Leukocytes, UA TRACE (*)    Squamous Epithelial / LPF 0-5 (*)    All other components within normal limits  CBG MONITORING, ED - Abnormal; Notable for the following components:   Glucose-Capillary 144 (*)    All other components within normal limits  URINE CULTURE  CBC  I-STAT BETA HCG BLOOD, ED ([facility], [facility], [facility] ONLY)    EKG  EKG Interpretation  Date/Time:  Friday May 05 2017 23:55:06 EST Ventricular Rate:  65 PR Interval:    QRS Duration: 100 QT Interval:  412 QTC Calculation: 429 R Axis:   93 Text Interpretation:  Sinus rhythm Prolonged PR interval Anterior infarct, old Abnormal T, consider ischemia, lateral leads Confirmed by Gilda Crease [phone]) on 05/06/2017 1:24:57 AM       Radiology No results found.  Procedures Procedures (including critical care time)  Medications Ordered in ED Medications  cephALEXin (KEFLEX) capsule 500 mg (not administered)  sodium chloride 0.9 % bolus 500 mL (500 mLs Intravenous New Bag/Given 05/06/17 0216)     Initial Impression / Assessment and Plan / ED Course  I have reviewed the triage vital signs and the nursing notes.  Pertinent labs & imaging results that were available during my care of the patient were reviewed by me and considered in my medical decision making (see chart for details).     Presents to the ER for evaluation of near syncope with dizziness.  Patient reports onset of symptoms tonight.  When she was walking down the hallway she felt very dizzy and like she was going to fall over.  She did not have loss of consciousness.  She did develop nausea with the symptoms.  She is improving here in the ER.  Her neurologic examination is normal, no focal deficits.  She does not require neuroimaging.  Blood work was unremarkable.  She does,  however, report that she has had some urinary frequency and there is leukocyte esterase and white cells in her urine.  This does not appear to be a contaminated specimen, will treat for UTI.  Follow-up with primary doctor for recheck.  Final Clinical Impressions(s) / ED Diagnoses   Final diagnoses:  Near syncope  Dizziness  Acute cystitis without hematuria    ED Discharge Orders        Ordered    cephALEXin (KEFLEX) 500 MG capsule  2 times daily     05/06/17 0304       Gilda Crease, MD 05/06/17 [phone]

## 2017-05-06 NOTE — ED Notes (Signed)
CBG 144 

## 2017-05-07 LAB — URINE CULTURE: CULTURE: NO GROWTH

## 2018-06-06 ENCOUNTER — Other Ambulatory Visit: Payer: Self-pay

## 2018-06-06 ENCOUNTER — Emergency Department (HOSPITAL_COMMUNITY)
Admission: EM | Admit: 2018-06-06 | Discharge: 2018-06-06 | Disposition: A | Discharge status: Home / Self Care | Payer: Medicaid Other | Attending: Emergency Medicine | Admitting: Emergency Medicine

## 2018-06-06 ENCOUNTER — Emergency Department (HOSPITAL_COMMUNITY): Payer: Medicaid Other

## 2018-06-06 ENCOUNTER — Encounter (HOSPITAL_COMMUNITY): Payer: Self-pay

## 2018-06-06 DIAGNOSIS — Y929 Unspecified place or not applicable: Secondary | ICD-10-CM | POA: Insufficient documentation

## 2018-06-06 DIAGNOSIS — Z79899 Other long term (current) drug therapy: Secondary | ICD-10-CM | POA: Insufficient documentation

## 2018-06-06 DIAGNOSIS — I2510 Atherosclerotic heart disease of native coronary artery without angina pectoris: Secondary | ICD-10-CM | POA: Insufficient documentation

## 2018-06-06 DIAGNOSIS — Z7982 Long term (current) use of aspirin: Secondary | ICD-10-CM | POA: Insufficient documentation

## 2018-06-06 DIAGNOSIS — S79921A Unspecified injury of right thigh, initial encounter: Secondary | ICD-10-CM | POA: Diagnosis present

## 2018-06-06 DIAGNOSIS — I130 Hypertensive heart and chronic kidney disease with heart failure and stage 1 through stage 4 chronic kidney disease, or unspecified chronic kidney disease: Secondary | ICD-10-CM | POA: Diagnosis not present

## 2018-06-06 DIAGNOSIS — Y999 Unspecified external cause status: Secondary | ICD-10-CM | POA: Diagnosis not present

## 2018-06-06 DIAGNOSIS — Z87891 Personal history of nicotine dependence: Secondary | ICD-10-CM | POA: Diagnosis not present

## 2018-06-06 DIAGNOSIS — I509 Heart failure, unspecified: Secondary | ICD-10-CM | POA: Diagnosis not present

## 2018-06-06 DIAGNOSIS — N183 Chronic kidney disease, stage 3 (moderate): Secondary | ICD-10-CM | POA: Diagnosis not present

## 2018-06-06 DIAGNOSIS — S7011XA Contusion of right thigh, initial encounter: Secondary | ICD-10-CM | POA: Diagnosis not present

## 2018-06-06 DIAGNOSIS — W208XXA Other cause of strike by thrown, projected or falling object, initial encounter: Secondary | ICD-10-CM | POA: Diagnosis not present

## 2018-06-06 DIAGNOSIS — Y939 Activity, unspecified: Secondary | ICD-10-CM | POA: Diagnosis not present

## 2018-06-06 DIAGNOSIS — M79604 Pain in right leg: Secondary | ICD-10-CM

## 2018-06-06 MED ORDER — ACETAMINOPHEN 325 MG PO TABS
650.0000 mg | ORAL_TABLET | Freq: Once | ORAL | Status: AC
  Administered 2018-06-06: 650 mg via ORAL
  Filled 2018-06-06: qty 2

## 2018-06-06 NOTE — ED Provider Notes (Signed)
[facility]-EMERGENCY DEPT Provider Note   CSN: [id number] Arrival date & time: 06/06/18  4098     History   Chief Complaint Chief Complaint  Patient presents with  . Leg Pain    HPI Suzanne Zuniga is a 58 y.o. female with history of hypertension, CAD, CKD who presents with a 3-day history of right leg pain when a piece of iron fell on it 3 days ago.  She reports this happened in [place].  She has been walking with a limp since.  She has not tried anything at home.  She denies any other injuries.  She denies any numbness or tingling.  HPI  Past Medical History:  Diagnosis Date  . Arthritis   . CKD (chronic kidney disease) stage 3, GFR 30-59 ml/min (HCC)   . Coronary artery disease   . Essential hypertension   . History of pneumonia   . HTN (hypertension)   . Idiopathic pericardial effusion    Uncertain etiology; was in setting of PNA has also been evaluated for inflammatory arthritis.;   . MI (mitral incompetence) 2012  . Palpitations   . Reactive airway disease    No diagnosis listed, but on albuterol and Symbicort  . Thyroid disease    HYPOTHYROID    Patient Active Problem List   Diagnosis Date Noted  . Chest pain with low risk for cardiac etiology 04/08/2016  . Obesity (BMI 30-39.9) 04/23/2014    Class: Chronic  . Dyspnea 04/23/2014  . Chronic heart failure (HCC) 02/13/2014  . Essential hypertension   . Idiopathic pericardial effusion     Past Surgical History:  Procedure Laterality Date  . PERICARDIOCENTESIS  2012   Likely parapneumonic  . TRANSTHORACIC ECHOCARDIOGRAM  08/2014   normal EF of 60-65%. No regional wall motion analysis. No effusion.(~6 months post pericardiocentesis)     OB History   No obstetric history on file.      Home Medications    Prior to Admission medications   Medication Sig Start Date End Date Taking? Authorizing Provider  aspirin EC 81 MG tablet Take 81 mg by mouth every morning.    [provider]  budesonide-formoterol (SYMBICORT) 160-4.5 MCG/ACT inhaler Inhale 2 puffs into the lungs 2 (two) times daily.    [provider]  cephALEXin (KEFLEX) 500 MG capsule Take 1 capsule (500 mg total) by mouth 2 (two) times daily. 05/06/17   Gilda Crease, MD  Cetirizine HCl 10 MG CAPS Take 1 capsule (10 mg total) by mouth at bedtime. 04/26/14   Shirleen Schirmer, PA-C  diltiazem (DILACOR XR) 180 MG 24 hr capsule Take 180 mg by mouth daily.    [provider]  fluticasone (FLONASE) 50 MCG/ACT nasal spray Place 2 sprays into both nostrils daily. 04/26/14   Shirleen Schirmer, PA-C  HYDROcodone-acetaminophen (NORCO/VICODIN) 5-325 MG tablet Take 1 tablet by mouth every 4 (four) hours as needed. 02/17/17   Elpidio Anis, PA-C  ibuprofen (ADVIL,MOTRIN) 600 MG tablet Take 1 tablet (600 mg total) by mouth every 6 (six) hours as needed. 02/17/17   Elpidio Anis, PA-C  levothyroxine (SYNTHROID, LEVOTHROID) 100 MCG tablet Take 100 mcg by mouth daily.    [provider]  losartan (COZAAR) 100 MG tablet Take 100 mg by mouth every morning.    [provider]    Family History Family History  Problem Relation Age of Onset  . Diabetes Other   . Hypertension Other     Social History Social History  Tobacco Use  . Smoking status: Former Games developermoker  . Smokeless tobacco: Never Used  . Tobacco comment: SMOKED IN HIGH SCHOOL  Substance Use Topics  . Alcohol use: No  . Drug use: No     Allergies   Aliskiren; Amlodipine; Lisinopril; and Lithium   Review of Systems Review of Systems  Musculoskeletal: Positive for arthralgias and myalgias.  Skin: Positive for color change.  Neurological: Negative for numbness.     Physical Exam Updated Vital Signs BP (!) 166/92 (BP Location: Right Arm)   Pulse 70   Temp 98.4 F (36.9 C) (Oral)   Resp 19   Ht 5\' 4"  (1.626 m)   Wt 81.6 kg   SpO2 100%   BMI 30.90 kg/m   Physical Exam Vitals signs and nursing  note reviewed.  Constitutional:      General: She is not in acute distress.    Appearance: She is well-developed. She is not diaphoretic.  HENT:     Head: Normocephalic and atraumatic.     Mouth/Throat:     Pharynx: No oropharyngeal exudate.  Eyes:     General: No scleral icterus.       Right eye: No discharge.        Left eye: No discharge.     Conjunctiva/sclera: Conjunctivae normal.     Pupils: Pupils are equal, round, and reactive to light.  Neck:     Musculoskeletal: Normal range of motion and neck supple.     Thyroid: No thyromegaly.  Cardiovascular:     Rate and Rhythm: Normal rate and regular rhythm.     Heart sounds: Normal heart sounds. No murmur. No friction rub. No gallop.   Pulmonary:     Effort: Pulmonary effort is normal. No respiratory distress.     Breath sounds: Normal breath sounds. No stridor. No wheezing or rales.  Abdominal:     General: Bowel sounds are normal. There is no distension.     Palpations: Abdomen is soft.     Tenderness: There is no abdominal tenderness. There is no guarding or rebound.  Musculoskeletal:       Legs:     Comments: Full range of motion of the right knee with no significant bony tenderness, but tenderness to the superior lateral aspect where it is ecchymotic; no laxity with varus and valgus stress  Lymphadenopathy:     Cervical: No cervical adenopathy.  Skin:    General: Skin is warm and dry.     Coloration: Skin is not pale.     Findings: No rash.  Neurological:     Mental Status: She is alert.     Coordination: Coordination normal.      ED Treatments / Results  Labs (all labs ordered are listed, but only abnormal results are displayed) Labs Reviewed - No data to display  EKG None  Radiology Dg Knee Complete 4 Views Right  Result Date: 06/06/2018 CLINICAL DATA:  Piece of iron fell on leg 2 days ago, having distal thigh and diffuse RIGHT knee pain EXAM: RIGHT KNEE - COMPLETE 4+ VIEW COMPARISON:  02/16/2017  FINDINGS: Osseous mineralization normal. Joint spaces preserved. Small corticated ossicle identified medial to the medial femoral condyle, appears old though new since the previous exam. No acute fracture, dislocation, or bone destruction. No knee joint effusion. IMPRESSION: No acute abnormalities. Electronically Signed   By: Ulyses SouthwardMark  Boles M.D.   On: 06/06/2018 12:49    Procedures Procedures (including critical care time)  Medications Ordered in ED  Medications  acetaminophen (TYLENOL) tablet 650 mg (650 mg Oral Given 06/06/18 1229)     Initial Impression / Assessment and Plan / ED Course  I have reviewed the triage vital signs and the nursing notes.  Pertinent labs & imaging results that were available during my care of the patient were reviewed by me and considered in my medical decision making (see chart for details).     Patient presenting with contusion to her leg.  X-rays negative.  Patient given Tylenol and ice in the ED.  Will continue same and provide knee sleeve for comfort.  No evidence of deep space injury at this time.  Patient is well-appearing and ambulatory.  Follow-up to PCP as needed.  Return precautions discussed.  Patient vital stable throughout ED course and discharged in satisfactory condition.  Patient's blood pressure elevated on arrival, but she took her blood pressure medication in triage.   Final Clinical Impressions(s) / ED Diagnoses   Final diagnoses:  Right leg pain  Contusion of right thigh, initial encounter    ED Discharge Orders    None       Emi HolesLaw, Hagen Tidd M, PA-C 06/06/18 1319    Raeford RazorKohut, Stephen, MD 06/06/18 1627

## 2018-06-06 NOTE — ED Notes (Signed)
Gave patient copy of patient advocate phone number and [employer] account executive) business card and informed her that will make director aware to come see her when she gets back to her office.   Suzanne Zuniga states, "THis is just ridiculous that been here this long and no one come to help see me and I have a kid in here too and had to wait this long. I will be complaining because this is just unacceptable!"  Informed patient that Trinna Post the PA wil be in shortly to see her and the child and left the room.

## 2018-06-06 NOTE — ED Triage Notes (Signed)
Pt states that she was hit into her right thigh by a piece of iron yesterday. Pt ambulatory in triage.

## 2018-06-06 NOTE — Discharge Instructions (Signed)
Use Tylenol every 6 hours as needed for your pain.  Elevate your leg whenever you are not walking on it.  Use ice 3-4 times daily alternating 20 minutes on, 20 minutes off.  Wear brace for comfort.  Please follow-up with your doctor if your symptoms are not improving over the next week to 10 days.  Please return to the emergency department he develop any new or worsening symptoms.

## 2018-06-06 NOTE — ED Notes (Signed)
Pt came out of room asking how long she has been here.  Informed patient that her total time in department is 3hr28 minutes. Pt upset that no provider has seen here and her being here this long.  This RN explained that the department is extremely busy today and the doctors are trying to see patients as fast as they can, but now our Minor Care/Fast Track area is open and Alex the provider will be coming to see her shortly.  Patient asking to speak with head person in charge. This RN offered for patient to speak with CHarge Nurse. But patient wanting to speak with director or someone higher up.  Informed patient that I would go make the director aware to come speak with her and that I would be glad to get the Patient Advocate phone number for her, since patient asking for paper complaint form to fill out.

## 2018-06-10 ENCOUNTER — Other Ambulatory Visit: Payer: Self-pay

## 2018-06-10 ENCOUNTER — Emergency Department (HOSPITAL_COMMUNITY): Payer: Medicaid Other

## 2018-06-10 ENCOUNTER — Encounter (HOSPITAL_COMMUNITY): Payer: Self-pay | Admitting: Emergency Medicine

## 2018-06-10 ENCOUNTER — Emergency Department (HOSPITAL_COMMUNITY)
Admission: EM | Admit: 2018-06-10 | Discharge: 2018-06-10 | Disposition: A | Discharge status: Home / Self Care | Payer: Medicaid Other | Attending: Emergency Medicine | Admitting: Emergency Medicine

## 2018-06-10 DIAGNOSIS — I252 Old myocardial infarction: Secondary | ICD-10-CM | POA: Insufficient documentation

## 2018-06-10 DIAGNOSIS — Z7982 Long term (current) use of aspirin: Secondary | ICD-10-CM | POA: Diagnosis not present

## 2018-06-10 DIAGNOSIS — I130 Hypertensive heart and chronic kidney disease with heart failure and stage 1 through stage 4 chronic kidney disease, or unspecified chronic kidney disease: Secondary | ICD-10-CM | POA: Insufficient documentation

## 2018-06-10 DIAGNOSIS — N183 Chronic kidney disease, stage 3 (moderate): Secondary | ICD-10-CM | POA: Diagnosis not present

## 2018-06-10 DIAGNOSIS — R0981 Nasal congestion: Secondary | ICD-10-CM | POA: Diagnosis present

## 2018-06-10 DIAGNOSIS — Z79899 Other long term (current) drug therapy: Secondary | ICD-10-CM | POA: Diagnosis not present

## 2018-06-10 DIAGNOSIS — J069 Acute upper respiratory infection, unspecified: Secondary | ICD-10-CM | POA: Diagnosis not present

## 2018-06-10 DIAGNOSIS — B9789 Other viral agents as the cause of diseases classified elsewhere: Secondary | ICD-10-CM | POA: Diagnosis not present

## 2018-06-10 DIAGNOSIS — I509 Heart failure, unspecified: Secondary | ICD-10-CM | POA: Diagnosis not present

## 2018-06-10 DIAGNOSIS — I2510 Atherosclerotic heart disease of native coronary artery without angina pectoris: Secondary | ICD-10-CM | POA: Insufficient documentation

## 2018-06-10 DIAGNOSIS — Z87891 Personal history of nicotine dependence: Secondary | ICD-10-CM | POA: Insufficient documentation

## 2018-06-10 MED ORDER — PROMETHAZINE-DM 6.25-15 MG/5ML PO SYRP: 5.0000 mL | ORAL_SOLUTION | Freq: Four times a day (QID) | ORAL | 0 refills | Status: AC | PRN

## 2018-06-10 NOTE — ED Provider Notes (Signed)
[facility] EMERGENCY DEPARTMENT Provider Note   CSN: [id number] Arrival date & time: 06/10/18  1148     History   Chief Complaint Chief Complaint  Patient presents with  . Nasal Congestion    HPI Suzanne Zuniga is a 58 y.o. female.  The history is provided by the patient.  URI  Presenting symptoms: congestion and cough   Presenting symptoms: no fever and no sore throat   Severity:  Mild Onset quality:  Gradual Duration:  5 days Timing:  Intermittent Progression:  Worsening Chronicity:  New Relieved by:  Nothing Worsened by:  Nothing Ineffective treatments:  OTC medications Associated symptoms: no arthralgias, no neck pain and no wheezing   Associated symptoms comment:  Dizziness, and dryness in mouth and throat. Risk factors: sick contacts   Risk factors: no recent travel     Past Medical History:  Diagnosis Date  . Arthritis   . CKD (chronic kidney disease) stage 3, GFR 30-59 ml/min (HCC)   . Coronary artery disease   . Essential hypertension   . History of pneumonia   . HTN (hypertension)   . Idiopathic pericardial effusion    Uncertain etiology; was in setting of PNA has also been evaluated for inflammatory arthritis.;   . MI (mitral incompetence) 2012  . Palpitations   . Reactive airway disease    No diagnosis listed, but on albuterol and Symbicort  . Thyroid disease    HYPOTHYROID    Patient Active Problem List   Diagnosis Date Noted  . Chest pain with low risk for cardiac etiology 04/08/2016  . Obesity (BMI 30-39.9) 04/23/2014    Class: Chronic  . Dyspnea 04/23/2014  . Chronic heart failure (HCC) 02/13/2014  . Essential hypertension   . Idiopathic pericardial effusion     Past Surgical History:  Procedure Laterality Date  . PERICARDIOCENTESIS  2012   Likely parapneumonic  . TRANSTHORACIC ECHOCARDIOGRAM  08/2014   normal EF of 60-65%. No regional wall motion analysis. No effusion.(~6 months post pericardiocentesis)     OB History   No  obstetric history on file.      Home Medications    Prior to Admission medications   Medication Sig Start Date End Date Taking? Authorizing Provider  aspirin EC 81 MG tablet Take 81 mg by mouth every morning.    [provider]  budesonide-formoterol (SYMBICORT) 160-4.5 MCG/ACT inhaler Inhale 2 puffs into the lungs 2 (two) times daily.    [provider]  cephALEXin (KEFLEX) 500 MG capsule Take 1 capsule (500 mg total) by mouth 2 (two) times daily. 05/06/17   Gilda CreasePollina, Christopher J, MD  Cetirizine HCl 10 MG CAPS Take 1 capsule (10 mg total) by mouth at bedtime. 04/26/14   Shirleen Schirmerllis, Courtney, PA-C  diltiazem (DILACOR XR) 180 MG 24 hr capsule Take 180 mg by mouth daily.    [provider]  fluticasone (FLONASE) 50 MCG/ACT nasal spray Place 2 sprays into both nostrils daily. 04/26/14   Shirleen Schirmerllis, Courtney, PA-C  HYDROcodone-acetaminophen (NORCO/VICODIN) 5-325 MG tablet Take 1 tablet by mouth every 4 (four) hours as needed. 02/17/17   Elpidio AnisUpstill, Shari, PA-C  ibuprofen (ADVIL,MOTRIN) 600 MG tablet Take 1 tablet (600 mg total) by mouth every 6 (six) hours as needed. 02/17/17   Elpidio AnisUpstill, Shari, PA-C  levothyroxine (SYNTHROID, LEVOTHROID) 100 MCG tablet Take 100 mcg by mouth daily.    [provider]  losartan (COZAAR) 100 MG tablet Take 100 mg by mouth every morning.    [provider]  Family History Family History  Problem Relation Age of Onset  . Diabetes Other   . Hypertension Other     Social History Social History   Tobacco Use  . Smoking status: Former Games developer  . Smokeless tobacco: Never Used  . Tobacco comment: SMOKED IN HIGH SCHOOL  Substance Use Topics  . Alcohol use: No  . Drug use: No     Allergies   Aliskiren; Amlodipine; Lisinopril; and Lithium   Review of Systems Review of Systems  Constitutional: Positive for appetite change. Negative for activity change and fever.       All ROS Neg except as noted in HPI  HENT: Positive for  congestion. Negative for nosebleeds and sore throat.   Eyes: Negative for photophobia and discharge.  Respiratory: Positive for cough. Negative for shortness of breath and wheezing.   Cardiovascular: Negative for chest pain and palpitations.  Gastrointestinal: Negative for abdominal pain and blood in stool.  Genitourinary: Negative for dysuria, frequency and hematuria.  Musculoskeletal: Negative for arthralgias, back pain and neck pain.  Skin: Negative.   Neurological: Negative for dizziness, seizures and speech difficulty.  Psychiatric/Behavioral: Negative for confusion and hallucinations.     Physical Exam Updated Vital Signs BP (!) 166/84 (BP Location: Right Arm)   Pulse 86   Temp 97.8 F (36.6 C) (Oral)   Resp 20   Ht 5\' 4"  (1.626 m)   Wt 81.6 kg   SpO2 98%   BMI 30.90 kg/m   Physical Exam Vitals signs and nursing note reviewed.  Constitutional:      Appearance: She is well-developed. She is not toxic-appearing.  HENT:     Head: Normocephalic.     Right Ear: Tympanic membrane and external ear normal.     Left Ear: Tympanic membrane and external ear normal.  Eyes:     General: Lids are normal.     Pupils: Pupils are equal, round, and reactive to light.  Neck:     Musculoskeletal: Normal range of motion and neck supple.     Vascular: No carotid bruit.  Cardiovascular:     Rate and Rhythm: Normal rate and regular rhythm.     Pulses: Normal pulses.     Heart sounds: Normal heart sounds.  Pulmonary:     Effort: No respiratory distress.     Breath sounds: Normal breath sounds.  Abdominal:     General: Bowel sounds are normal.     Palpations: Abdomen is soft.     Tenderness: There is no abdominal tenderness. There is no guarding.  Musculoskeletal: Normal range of motion.  Lymphadenopathy:     Head:     Right side of head: No submandibular adenopathy.     Left side of head: No submandibular adenopathy.     Cervical: No cervical adenopathy.  Skin:    General: Skin  is warm and dry.  Neurological:     Mental Status: She is alert and oriented to person, place, and time.     Cranial Nerves: No cranial nerve deficit.     Sensory: No sensory deficit.  Psychiatric:        Speech: Speech normal.      ED Treatments / Results  Labs (all labs ordered are listed, but only abnormal results are displayed) Labs Reviewed - No data to display  EKG None  Radiology No results found.  Procedures Procedures (including critical care time)  Medications Ordered in ED Medications - No data to display   Initial Impression /  Assessment and Plan / ED Course  I have reviewed the triage vital signs and the nursing notes.  Pertinent labs & imaging results that were available during my care of the patient were reviewed by me and considered in my medical decision making (see chart for details).       Final Clinical Impressions(s) / ED Diagnoses MDM  Blood pressure is elevated, otherwise vital signs within normal limits.  Pulse oximetry is 100% on room air.  Within normal limits by my interpretation.  Patient presented with nasal congestion and upper respiratory infection symptoms.  A chest x-ray was obtained and found to be within normal limits.  Patient is ambulatory in the room, as well as the hall.  Patient in no distress. Patient advised to increase fluids.  Wash hands frequently.  Use Tylenol extra strength every 4 hours.  Prescription for Promethazine DM given to the patient to use for cough and congestion.  Patient is in agreement with this plan.   Final diagnoses:  Viral URI with cough    ED Discharge Orders         Ordered    promethazine-dextromethorphan (PROMETHAZINE-DM) 6.25-15 MG/5ML syrup  Every 6 hours PRN     06/10/18 1507           Ivery Quale, PA-C 06/11/18 2304    Raeford Razor, MD 06/13/18 1243

## 2018-06-10 NOTE — ED Triage Notes (Signed)
Congestion since last night   No flu shot   No N/V/D

## 2018-06-10 NOTE — Discharge Instructions (Addendum)
Your vital signs are within normal limits with exception of your blood pressure being slightly elevated at 166/84.  Please have your doctor recheck this.  Your chest x-ray is within normal limits.  Your examination favors an upper respiratory infection with cough.  Please increase fluids.  Use your mask until symptoms have resolved.  Please use Promethazine DM for cough.  This medication may cause drowsiness.  Please use it with caution. See your MD for additional evaluation if not improving.

## 2018-07-19 ENCOUNTER — Ambulatory Visit: Payer: Medicaid Other | Admitting: Sports Medicine

## 2019-11-16 IMAGING — DX DG CHEST 2V
2 series · 2 of 2 positions shown · non-contrast
Comparison: 04/24/2014

CLINICAL DATA: Cough, congestion

EXAM:
CHEST - 2 VIEW

[chest pa]
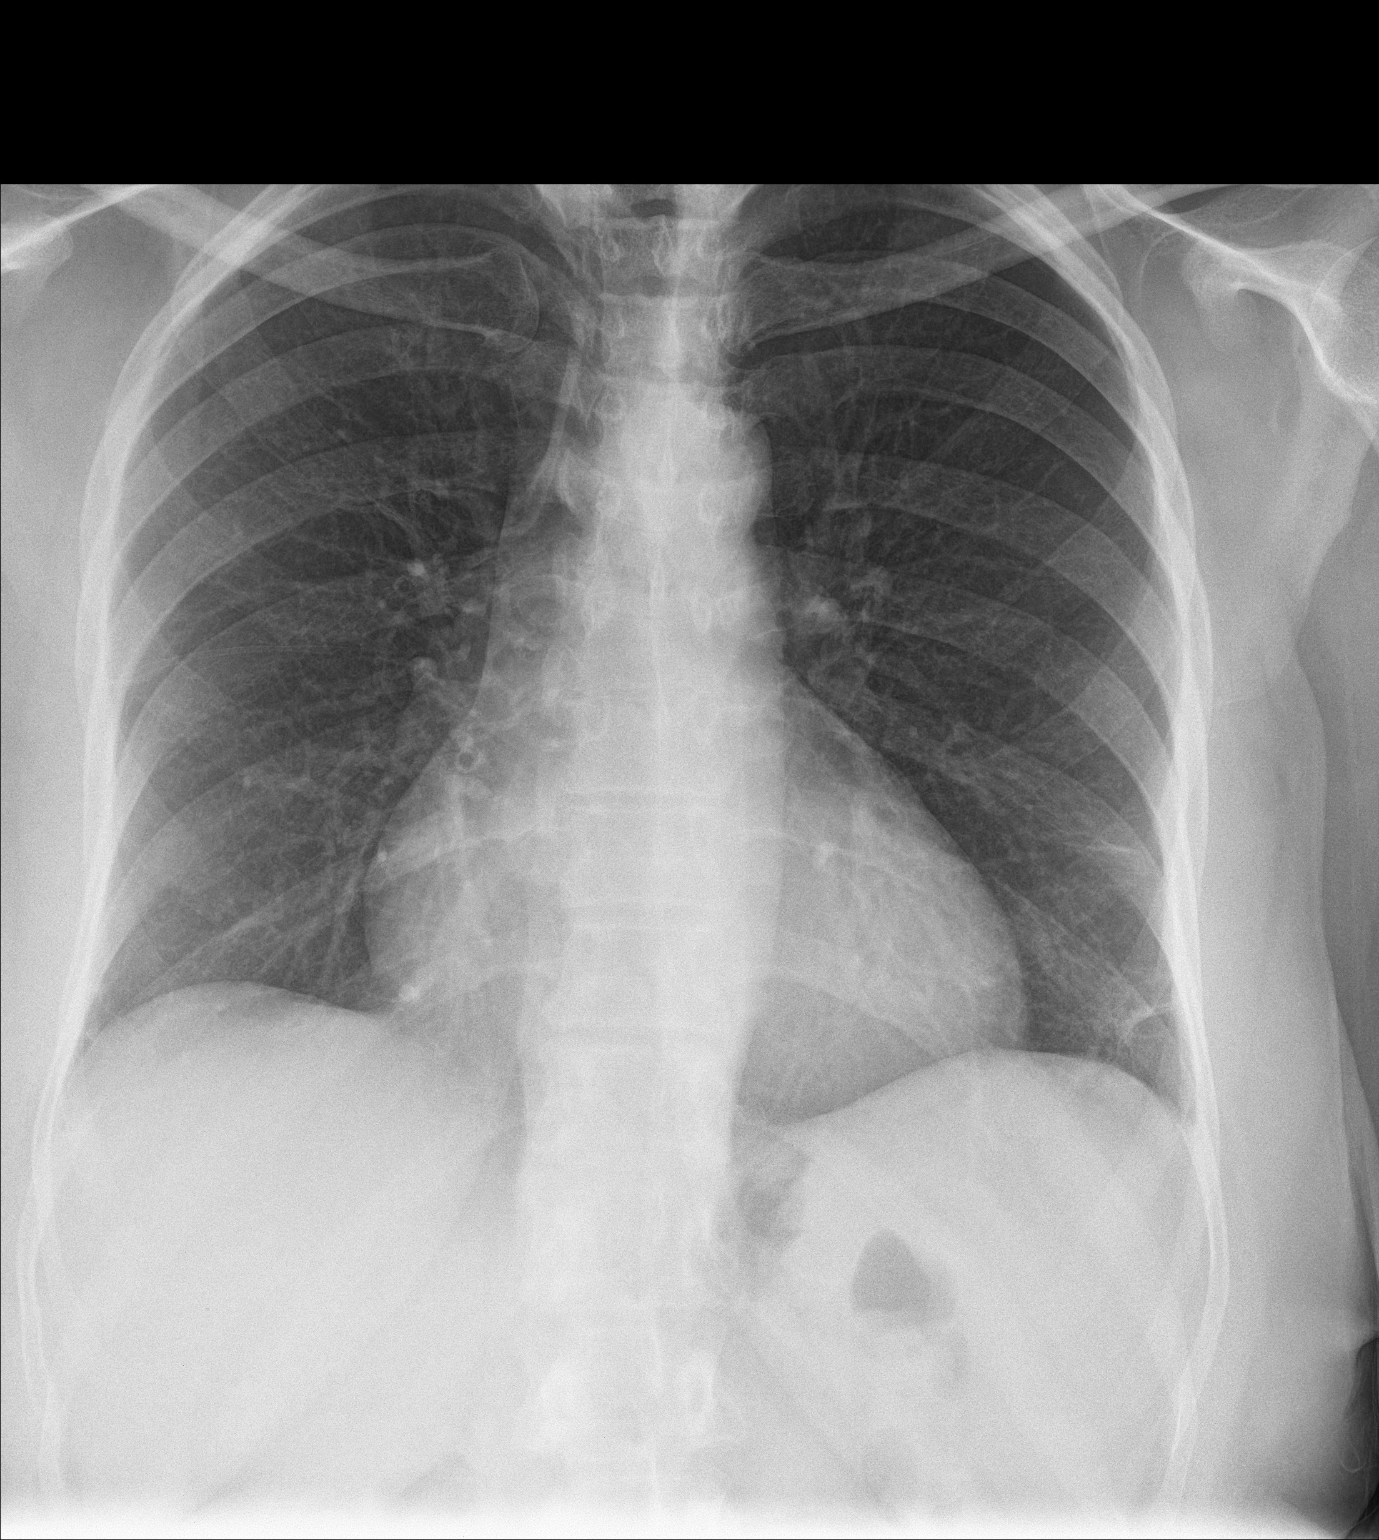

[chest lat]
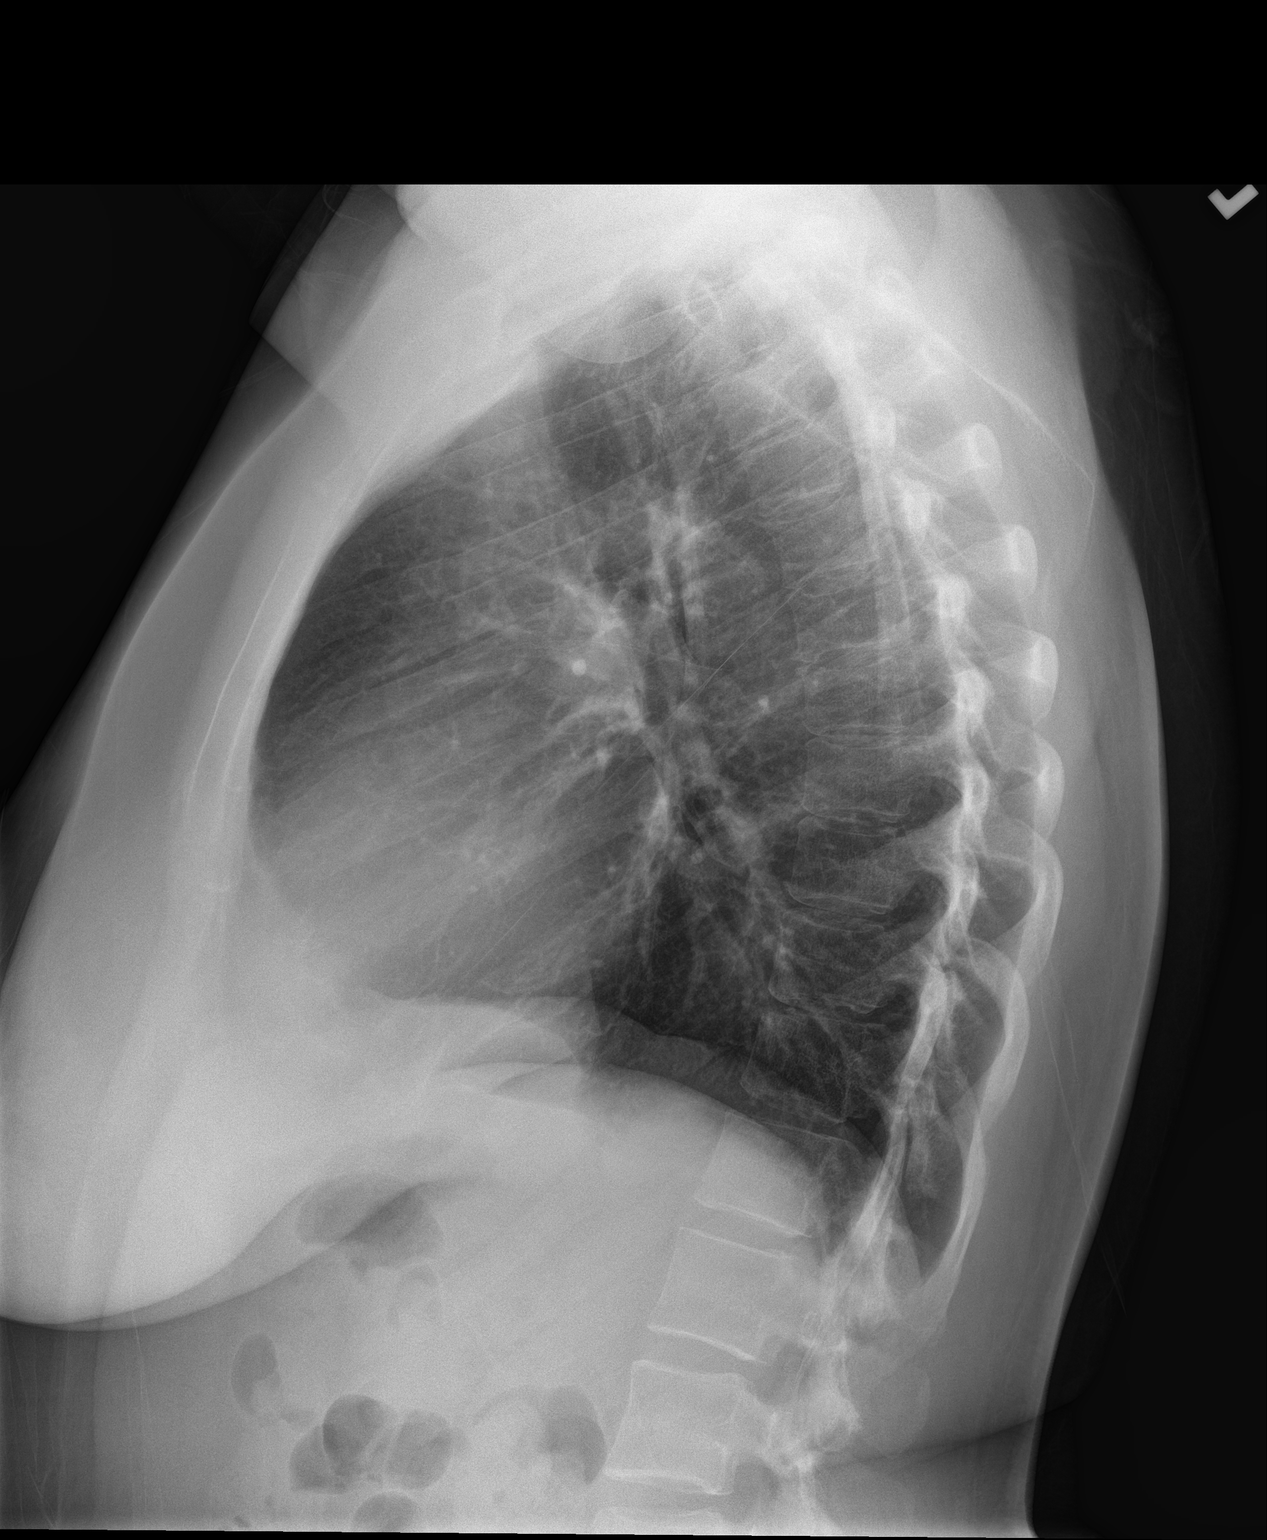

[2 of 2 positions shown; findings below may reference images not displayed]

FINDINGS: Lungs are essentially clear. Mild lingular/left lower lung scarring,
chronic. No pleural effusion or pneumothorax.

The heart is normal in size.

Visualized osseous structures are within normal limits.
IMPRESSION: Normal chest radiographs.

## 2020-09-01 ENCOUNTER — Ambulatory Visit: Payer: Medicaid Other | Admitting: Internal Medicine

## 2020-09-01 ENCOUNTER — Telehealth: Payer: Self-pay | Admitting: *Deleted

## 2020-09-01 NOTE — Telephone Encounter (Signed)
Caller Name Aralyn Nowak Caller Phone Number [phone] Patient Name Suzanne Zuniga Patient DOB [birth year] Call Type Message Only Information Provided Reason for Call Request to [facility] Appointment Initial Comment Needing to cancel appt for today at 240pm. Patient request to speak to RN No Disp. Time Disposition Final User 09/01/2020 8:06:37 AM General Information Provided Yes Jory Ee

## 2020-09-01 NOTE — Progress Notes (Deleted)
Name: Suzanne Zuniga  MRN/ DOB: [id number], [birth year]    Age/ Sex: 60 y.o., female    PCP: Dois Davenport, MD   Reason for Endocrinology Evaluation: Hypothyroidism     Date of Initial Endocrinology Evaluation: 09/01/2020     HPI: Suzanne Zuniga is a 60 y.o. female with a past medical history of ***. The patient presented for initial endocrinology clinic visit on 09/01/2020 for consultative assistance with her Hypothyroidism.   ***  HISTORY:  Past Medical History:  Past Medical History:  Diagnosis Date  . Arthritis   . CKD (chronic kidney disease) stage 3, GFR 30-59 ml/min (HCC)   . Coronary artery disease   . Essential hypertension   . History of pneumonia   . HTN (hypertension)   . Idiopathic pericardial effusion    Uncertain etiology; was in setting of PNA has also been evaluated for inflammatory arthritis.;   . MI (mitral incompetence) 2012  . Palpitations   . Reactive airway disease    No diagnosis listed, but on albuterol and Symbicort  . Thyroid disease    HYPOTHYROID    Past Surgical History:  Past Surgical History:  Procedure Laterality Date  . PERICARDIOCENTESIS  2012   Likely parapneumonic  . TRANSTHORACIC ECHOCARDIOGRAM  08/2014   normal EF of 60-65%. No regional wall motion analysis. No effusion.(~6 months post pericardiocentesis)      Social History:  reports that she has quit smoking. She has never used smokeless tobacco. She reports that she does not drink alcohol and does not use drugs.  Family History: family history includes Diabetes in an other family member; Hypertension in an other family member.   HOME MEDICATIONS: Allergies as of 09/01/2020      Reactions   Aliskiren Itching, Rash   Amlodipine Itching, Rash   Lisinopril Itching, Rash   Lithium Itching, Rash      Medication List       Accurate as of Sep 01, 2020  7:34 AM. If you have any questions, ask your nurse or doctor.        aspirin EC 81 MG tablet Take 81 mg by mouth every  morning.   budesonide-formoterol 160-4.5 MCG/ACT inhaler Commonly known as: SYMBICORT Inhale 2 puffs into the lungs 2 (two) times daily.   cephALEXin 500 MG capsule Commonly known as: KEFLEX Take 1 capsule (500 mg total) by mouth 2 (two) times daily.   Cetirizine HCl 10 MG Caps Take 1 capsule (10 mg total) by mouth at bedtime.   diltiazem 180 MG 24 hr capsule Commonly known as: DILACOR XR Take 180 mg by mouth daily.   fluticasone 50 MCG/ACT nasal spray Commonly known as: FLONASE Place 2 sprays into both nostrils daily.   HYDROcodone-acetaminophen 5-325 MG tablet Commonly known as: NORCO/VICODIN Take 1 tablet by mouth every 4 (four) hours as needed.   ibuprofen 600 MG tablet Commonly known as: ADVIL Take 1 tablet (600 mg total) by mouth every 6 (six) hours as needed.   levothyroxine 100 MCG tablet Commonly known as: SYNTHROID Take 100 mcg by mouth daily.   losartan 100 MG tablet Commonly known as: COZAAR Take 100 mg by mouth every morning.   promethazine-dextromethorphan 6.25-15 MG/5ML syrup Commonly known as: PROMETHAZINE-DM Take 5 mLs by mouth every 6 (six) hours as needed.         REVIEW OF SYSTEMS: A comprehensive ROS was conducted with the patient and is negative except as per HPI and below:  ROS  OBJECTIVE:  VS: There were no vitals taken for this visit.   Wt Readings from Last 3 Encounters:  06/10/18 180 lb (81.6 kg)  06/06/18 180 lb (81.6 kg)  05/06/17 195 lb (88.5 kg)     EXAM: General: Pt appears well and is in NAD  Hydration: Well-hydrated with moist mucous membranes and good skin turgor  Eyes: External eye exam normal without stare, lid lag or exophthalmos.  EOM intact.  PERRL.  Ears, Nose, Throat: Hearing: Grossly intact bilaterally Dental: Good dentition  Throat: Clear without mass, erythema or exudate  Neck: General: Supple without adenopathy. Thyroid: Thyroid size normal.  No goiter or nodules appreciated. No thyroid bruit.   Lungs: Clear with good BS bilat with no rales, rhonchi, or wheezes  Heart: Auscultation: RRR.  Abdomen: Normoactive bowel sounds, soft, nontender, without masses or organomegaly palpable  Extremities: Gait and station: Normal gait  Digits and nails: No clubbing, cyanosis, petechiae, or nodes Head and neck: Normal alignment and mobility BL UE: Normal ROM and strength. BL LE: No pretibial edema normal ROM and strength.  Skin: Hair: Texture and amount normal with gender appropriate distribution Skin Inspection: No rashes, acanthosis nigricans/skin tags. No lipohypertrophy Skin Palpation: Skin temperature, texture, and thickness normal to palpation  Neuro: Cranial nerves: II - XII grossly intact  Cerebellar: Normal coordination and movement; no tremor Motor: Normal strength throughout DTRs: 2+ and symmetric in UE without delay in relaxation phase  Mental Status: Judgment, insight: Intact Orientation: Oriented to time, place, and person Memory: Intact for recent and remote events Mood and affect: No depression, anxiety, or agitation     DATA REVIEWED: ***    ASSESSMENT/PLAN/RECOMMENDATIONS:   1. Hypothyroidism:    Medications :  Signed electronically by: Lyndle Herrlich, MD  [facility] Endocrinology  [facility] [address] [place] [place], [place] [address] Phone: [phone] FAX: [phone]   CC: Dois Davenport, MD [address] [place] [address] Phone: [phone] Fax: [phone]   Return to Endocrinology clinic as below: Future Appointments  Date Time Provider Department Center  09/01/2020  2:20 PM Daiki Dicostanzo, Konrad Dolores, MD [facility]

## 2020-09-15 ENCOUNTER — Ambulatory Visit: Payer: Medicaid Other | Admitting: Internal Medicine

## 2020-09-15 NOTE — Progress Notes (Deleted)
Name: Suzanne Zuniga  MRN/ DOB: [id number], Oct 02, 1960    Age/ Sex: 60 y.o., female    PCP: Dois Davenport, MD   Reason for Endocrinology Evaluation: Hypothyroidism     Date of Initial Endocrinology Evaluation: 09/15/2020     HPI: Ms. Suzanne Zuniga is a 60 y.o. female with a past medical history of HTN and Hypothyroidism. The patient presented for initial endocrinology clinic visit on 09/15/2020 for consultative assistance with her Hypothyroidism.   She has been diagnosed with hypothyroidism   HISTORY:  Past Medical History:  Past Medical History:  Diagnosis Date  . Arthritis   . CKD (chronic kidney disease) stage 3, GFR 30-59 ml/min (HCC)   . Coronary artery disease   . Essential hypertension   . History of pneumonia   . HTN (hypertension)   . Idiopathic pericardial effusion    Uncertain etiology; was in setting of PNA has also been evaluated for inflammatory arthritis.;   . MI (mitral incompetence) 2012  . Palpitations   . Reactive airway disease    No diagnosis listed, but on albuterol and Symbicort  . Thyroid disease    HYPOTHYROID   Past Surgical History:  Past Surgical History:  Procedure Laterality Date  . PERICARDIOCENTESIS  2012   Likely parapneumonic  . TRANSTHORACIC ECHOCARDIOGRAM  08/2014   normal EF of 60-65%. No regional wall motion analysis. No effusion.(~6 months post pericardiocentesis)      Social History:  reports that she has quit smoking. She has never used smokeless tobacco. She reports that she does not drink alcohol and does not use drugs.  Family History: family history includes Diabetes in an other family member; Hypertension in an other family member.   HOME MEDICATIONS: Allergies as of 09/15/2020      Reactions   Aliskiren Itching, Rash   Amlodipine Itching, Rash   Lisinopril Itching, Rash   Lithium Itching, Rash      Medication List       Accurate as of Sep 15, 2020 12:50 PM. If you have any questions, ask your nurse or doctor.         aspirin EC 81 MG tablet Take 81 mg by mouth every morning.   budesonide-formoterol 160-4.5 MCG/ACT inhaler Commonly known as: SYMBICORT Inhale 2 puffs into the lungs 2 (two) times daily.   cephALEXin 500 MG capsule Commonly known as: KEFLEX Take 1 capsule (500 mg total) by mouth 2 (two) times daily.   Cetirizine HCl 10 MG Caps Take 1 capsule (10 mg total) by mouth at bedtime.   diltiazem 180 MG 24 hr capsule Commonly known as: DILACOR XR Take 180 mg by mouth daily.   fluticasone 50 MCG/ACT nasal spray Commonly known as: FLONASE Place 2 sprays into both nostrils daily.   HYDROcodone-acetaminophen 5-325 MG tablet Commonly known as: NORCO/VICODIN Take 1 tablet by mouth every 4 (four) hours as needed.   ibuprofen 600 MG tablet Commonly known as: ADVIL Take 1 tablet (600 mg total) by mouth every 6 (six) hours as needed.   levothyroxine 100 MCG tablet Commonly known as: SYNTHROID Take 100 mcg by mouth daily.   losartan 100 MG tablet Commonly known as: COZAAR Take 100 mg by mouth every morning.   promethazine-dextromethorphan 6.25-15 MG/5ML syrup Commonly known as: PROMETHAZINE-DM Take 5 mLs by mouth every 6 (six) hours as needed.         REVIEW OF SYSTEMS: A comprehensive ROS was conducted with the patient and is negative except as per HPI  and below:  ROS     OBJECTIVE:  VS: There were no vitals taken for this visit.   Wt Readings from Last 3 Encounters:  06/10/18 180 lb (81.6 kg)  06/06/18 180 lb (81.6 kg)  05/06/17 195 lb (88.5 kg)     EXAM: General: Pt appears well and is in NAD  Hydration: Well-hydrated with moist mucous membranes and good skin turgor  Eyes: External eye exam normal without stare, lid lag or exophthalmos.  EOM intact.  PERRL.  Ears, Nose, Throat: Hearing: Grossly intact bilaterally Dental: Good dentition  Throat: Clear without mass, erythema or exudate  Neck: General: Supple without adenopathy. Thyroid: Thyroid size  normal.  No goiter or nodules appreciated. No thyroid bruit.  Lungs: Clear with good BS bilat with no rales, rhonchi, or wheezes  Heart: Auscultation: RRR.  Abdomen: Normoactive bowel sounds, soft, nontender, without masses or organomegaly palpable  Extremities: Gait and station: Normal gait  Digits and nails: No clubbing, cyanosis, petechiae, or nodes Head and neck: Normal alignment and mobility BL UE: Normal ROM and strength. BL LE: No pretibial edema normal ROM and strength.  Skin: Hair: Texture and amount normal with gender appropriate distribution Skin Inspection: No rashes, acanthosis nigricans/skin tags. No lipohypertrophy Skin Palpation: Skin temperature, texture, and thickness normal to palpation  Neuro: Cranial nerves: II - XII grossly intact  Cerebellar: Normal coordination and movement; no tremor Motor: Normal strength throughout DTRs: 2+ and symmetric in UE without delay in relaxation phase  Mental Status: Judgment, insight: Intact Orientation: Oriented to time, place, and person Memory: Intact for recent and remote events Mood and affect: No depression, anxiety, or agitation     DATA REVIEWED: ***   10/24/2019 TSH 21.0 uIU/mL    ASSESSMENT/PLAN/RECOMMENDATIONS:   1. Hypothyroidism:    Medications :  Signed electronically by: Lyndle Herrlich, MD  [facility]  [facility] [address] [place], [place] [address] Phone: [phone] FAX: [phone]   CC: Dois Davenport, MD [address] [place] [address] [place] [address] Phone: [phone] Fax: [phone]   Return to Endocrinology clinic as below: Future Appointments  Date Time Provider Department Center  09/15/2020  2:20 PM Brenna Friesenhahn, Konrad Dolores, MD [facility]

## 2021-09-06 LAB — TSH
EGFR: 63
HM HIV Screening: NEGATIVE
TSH: 41.7 — AB (ref 0.41–5.90)

## 2021-10-28 ENCOUNTER — Encounter: Payer: Medicaid Other | Admitting: Nurse Practitioner

## 2021-10-28 NOTE — Patient Instructions (Signed)

## 2021-10-29 NOTE — Progress Notes (Signed)
Erroneous encounter

## 2021-12-07 ENCOUNTER — Ambulatory Visit: Payer: Medicaid Other | Admitting: Nurse Practitioner

## 2022-06-07 LAB — LAB REPORT - SCANNED
A1c: 5.3
EGFR: 73

## 2022-07-21 LAB — LAB REPORT - SCANNED
HM Hepatitis Screen: NEGATIVE
POC INR: 1

## 2023-01-17 NOTE — Progress Notes (Signed)
Office Visit Note  Patient: Suzanne Zuniga             Date of Birth: 16-Apr-1961           MRN: [id number]             PCP: Dois Davenport, MD Referring: Lance Bosch, NP Visit Date: 01/31/2023 Occupation: @GUAROCC @  Subjective:  Pain in multiple joints  History of Present Illness: Suzanne Zuniga is a 62 y.o. female seen in consultation per request of her PCP.  According the patient she has had pain and discomfort in the bilateral hands for the last 5 years.  She notices intermittent swelling.  She also has discomfort in her knee joints for the last 6 to 7 years.  She states she used to do a lot of work with [employer].  She recently fell at [employer] on January 05, 2023.  She states after that she was evaluated at the urgent care and was found to have knee joint swelling.  She states she started using Ace bandage which helped.  She denies discomfort in any other joints.    Activities of Daily Living:  Patient reports morning stiffness for 0 minutes.   Patient Denies nocturnal pain.  Difficulty dressing/grooming: Denies Difficulty climbing stairs: Reports Difficulty getting out of chair: Reports Difficulty using hands for taps, buttons, cutlery, and/or writing: Reports  Review of Systems  Constitutional:  Positive for fatigue.  HENT:  Negative for mouth sores and mouth dryness.   Eyes:  Negative for dryness.  Respiratory:  Negative for shortness of breath.   Cardiovascular:  Positive for chest pain. Negative for palpitations.  Gastrointestinal:  Negative for blood in stool, constipation and diarrhea.  Endocrine: Negative for increased urination.  Genitourinary:  Negative for involuntary urination.  Musculoskeletal:  Positive for joint pain, gait problem, joint pain, joint swelling, myalgias, muscle weakness, muscle tenderness and myalgias. Negative for morning stiffness.  Skin:  Negative for color change, rash, hair loss and sensitivity to sunlight.  Allergic/Immunologic:  Positive for susceptible to infections.  Neurological:  Negative for dizziness, numbness and headaches.  Hematological:  Negative for swollen glands.  Psychiatric/Behavioral:  Positive for depressed mood and sleep disturbance. The patient is nervous/anxious.     PMFS History:  Patient Active Problem List   Diagnosis Date Noted   Chest pain with low risk for cardiac etiology 04/08/2016   Obesity (BMI 30-39.9) 04/23/2014    Class: Chronic   Dyspnea 04/23/2014   Chronic heart failure (HCC) 02/13/2014   Essential hypertension    Idiopathic pericardial effusion     Past Medical History:  Diagnosis Date   Arthritis    CKD (chronic kidney disease) stage 3, GFR 30-59 ml/min (HCC)    Coronary artery disease    Essential hypertension    History of pneumonia    HTN (hypertension)    Idiopathic pericardial effusion    Uncertain etiology; was in setting of PNA has also been evaluated for inflammatory arthritis.;    MI (mitral incompetence) 2012   Palpitations    Reactive airway disease    No diagnosis listed, but on albuterol and Symbicort   Thyroid disease    HYPOTHYROID    Family History  Problem Relation Age of Onset   Heart disease Mother    Diabetes Other    Hypertension Other    Healthy Son    Healthy Son    Healthy Daughter    Past Surgical History:  Procedure Laterality Date  PERICARDIOCENTESIS  2012   Likely parapneumonic   TRANSTHORACIC ECHOCARDIOGRAM  08/2014   normal EF of 60-65%. No regional wall motion analysis. No effusion.(~6 months post pericardiocentesis)   Social History   Social History Narrative   Single. Former smoker. Occasional alcohol.    There is no immunization history on file for this patient.   Objective: Vital Signs: BP (!) 200/80 (BP Location: Right Arm, Patient Position: Sitting, Cuff Size: Large)   Pulse 60   Resp 15   Ht 5\' 4"  (1.626 m) Comment: with shoes on  Wt 179 lb 3.2 oz (81.3 kg)   BMI 30.76 kg/m    Physical Exam Vitals  and nursing note reviewed.  Constitutional:      Appearance: She is well-developed.  HENT:     Head: Normocephalic and atraumatic.  Eyes:     Conjunctiva/sclera: Conjunctivae normal.  Cardiovascular:     Rate and Rhythm: Normal rate and regular rhythm.     Heart sounds: Normal heart sounds.  Pulmonary:     Effort: Pulmonary effort is normal.     Breath sounds: Normal breath sounds.  Abdominal:     General: Bowel sounds are normal.     Palpations: Abdomen is soft.  Musculoskeletal:     Cervical back: Normal range of motion.  Lymphadenopathy:     Cervical: No cervical adenopathy.  Skin:    General: Skin is warm and dry.     Capillary Refill: Capillary refill takes less than 2 seconds.  Neurological:     Mental Status: She is alert and oriented to person, place, and time.  Psychiatric:        Behavior: Behavior normal.      Musculoskeletal Exam: Cervical, thoracic and lumbar spine 1 good range of motion.  Shoulder joints, elbow joints, wrist joints, MCPs PIPs and DIPs with good range of motion.  She had bilateral PIP and DIP thickening with no synovitis.  Hip joints and knee joints were in good range of motion.  She has crepitus with range of motion of her right knee joint.  No warmth swelling or effusion was noted.  There was no tenderness over ankles or MTPs.  CDAI Exam: CDAI Score: -- Patient Global: --; Provider Global: -- Swollen: --; Tender: -- Joint Exam 01/31/2023   No joint exam has been documented for this visit   There is currently no information documented on the homunculus. Go to the Rheumatology activity and complete the homunculus joint exam.  Investigation: No additional findings.  Imaging: No results found.  Recent Labs: Lab Results  Component Value Date   WBC 4.1 05/06/2017   HGB 14.5 05/06/2017   PLT 194 05/06/2017   NA 139 05/06/2017   K 3.6 05/06/2017   CL 105 05/06/2017   CO2 27 05/06/2017   GLUCOSE 140 (H) 05/06/2017   BUN 13 05/06/2017    CREATININE 0.94 05/06/2017   BILITOT 1.5 (H) 03/26/2013   ALKPHOS 243 (H) 03/26/2013   AST 42 (H) 03/26/2013   ALT 38 (H) 03/26/2013   PROT 8.0 03/26/2013   ALBUMIN 4.3 03/26/2013   CALCIUM 9.3 05/06/2017   GFRAA >60 05/06/2017   July 21, 2022 hepatitis B-, hepatitis C negative, iron studies normal, antigliadin IgG negative, endomysial antibody negative, anti-smooth muscle antibody 1: 160, ANA positive, ceruloplasmin normal, alpha-1 antitrypsin negative, ferritin normal, hereditary hemochromatosis DNA negative June 06, 2022 CBC WBC 5.1 hemoglobin 15.6, platelets 211, CMP calcium 10.5, alkaline phosphatase 559, AST 80, ALT 75, LDL 114, hemoglobin  A1c 5.3, TSH low at 0.291 June 14, 2022 alkaline phosphatase isoenzyme liver fraction 87 07/31/2020 alkaline phosphatase 395, AST 47, ALT 34, TSH 41.7, rheumatoid factor 16.2, AST 43 October 24, 2019 HIV nonreactive, Lyme negative, anti-CCP 5, ANA positive, dsDNA negative, RNP negative, Smith negative, SSA negative, SSB negative, uric acid 5.5, sed rate 15, ASO negative, C-reactive protein normal  Speciality Comments: No specialty comments available.  Procedures:  No procedures performed Allergies: Aliskiren, Amlodipine, Lisinopril, and Lithium   Assessment / Plan:     Visit Diagnoses: Polyarthralgia-patient complains of pain and discomfort in the bilateral hands and her knee joints.  Pain in both hands -she complains of pain and stiffness in her bilateral hands for the last 5 years.  She notices intermittent swelling in her hands.  No synovitis was noted on the examination today.  Bilateral PIP and DIP thickening was noted.  I will obtain x-rays and labs today.  Plan: XR Hand 2 View Right, XR Hand 2 View Left, x-rays obtained today were suggestive of osteoarthritis.  Rheumatoid factor, Cyclic citrul peptide antibody, IgG.  A handout on hand exercises was given.  Chronic pain of both knees -patient states that she has done a lot of hard  work over the years and she did hard flooring and renovation of her home.  She has fallen several times in the past.  She had a fall last year when she fell on the concrete floor.  She had another fall in September 2024 when she fell at [employer] and landed on her both knees.  She is concerned about frequent falls.  No warmth swelling or effusion was noted.  She is some crepitus in her knee joints on the examination.  I will obtain x-rays today.  A handout on lower extremity exercises was given.  I will also refer her to physical therapy.  Plan: XR KNEE 3 VIEW RIGHT, XR KNEE 3 VIEW LEFT.  X-rays of bilateral knee joints were unremarkable.  Frequent falls-I will refer her to physical therapy for fall prevention.  Positive ANA (antinuclear antibody) - October 24, 2019 ANA positive no titer given.  ENA negative.  July 21, 2022 ANA positive -mild to further testing today.  Plan: ANA, Anti-scleroderma antibody, RNP Antibody, Anti-Smith antibody, Sjogrens syndrome-A extractable nuclear antibody, Sjogrens syndrome-B extractable nuclear antibody, Anti-DNA antibody, double-stranded, C3 and C4, Sedimentation rate, Protein / creatinine ratio, urine  Rheumatoid factor positive - July 31, 2020 RF 16.2.  Patient had no synovitis on the examination.  Elevated LFTs-patient has been evaluated in the past.  Chronic heart failure, unspecified heart failure type (HCC)  Essential hypertension-blood pressure was elevated at 200/80 today.  Patient believes that it was because of not taking her medications this morning and also drinking coffee.  She declined to go to the emergency room or her PCP.  We notified her PCP.  Idiopathic pericardial effusion - 2012  Orders: Orders Placed This Encounter  Procedures   XR Hand 2 View Right   XR Hand 2 View Left   XR KNEE 3 VIEW RIGHT   XR KNEE 3 VIEW LEFT   Rheumatoid factor   Cyclic citrul peptide antibody, IgG   ANA   Anti-scleroderma antibody   RNP Antibody   Anti-Smith  antibody   Sjogrens syndrome-A extractable nuclear antibody   Sjogrens syndrome-B extractable nuclear antibody   Anti-DNA antibody, double-stranded   C3 and C4   Sedimentation rate   Protein / creatinine ratio, urine   Ambulatory referral to  Physical Therapy   No orders of the defined types were placed in this encounter.    Follow-Up Instructions: Return for Polyarthralgia.   Pollyann Savoy, MD  Note - This record has been created using Animal nutritionist.  Chart creation errors have been sought, but may not always  have been located. Such creation errors do not reflect on  the standard of medical care.

## 2023-01-31 ENCOUNTER — Telehealth: Payer: Self-pay | Admitting: Rheumatology

## 2023-01-31 ENCOUNTER — Ambulatory Visit: Payer: Medicaid Other | Attending: Rheumatology | Admitting: Rheumatology

## 2023-01-31 ENCOUNTER — Ambulatory Visit (INDEPENDENT_AMBULATORY_CARE_PROVIDER_SITE_OTHER): Payer: Medicaid Other

## 2023-01-31 ENCOUNTER — Encounter: Payer: Self-pay | Admitting: Rheumatology

## 2023-01-31 ENCOUNTER — Ambulatory Visit: Payer: Medicaid Other

## 2023-01-31 ENCOUNTER — Telehealth: Payer: Self-pay

## 2023-01-31 VITALS — BP 200/80 | HR 60 | Resp 15 | Ht 64.0 in | Wt 179.2 lb

## 2023-01-31 DIAGNOSIS — M25561 Pain in right knee: Secondary | ICD-10-CM

## 2023-01-31 DIAGNOSIS — R296 Repeated falls: Secondary | ICD-10-CM

## 2023-01-31 DIAGNOSIS — G8929 Other chronic pain: Secondary | ICD-10-CM

## 2023-01-31 DIAGNOSIS — R768 Other specified abnormal immunological findings in serum: Secondary | ICD-10-CM

## 2023-01-31 DIAGNOSIS — M79641 Pain in right hand: Secondary | ICD-10-CM | POA: Diagnosis not present

## 2023-01-31 DIAGNOSIS — M2550 Pain in unspecified joint: Secondary | ICD-10-CM

## 2023-01-31 DIAGNOSIS — M25562 Pain in left knee: Secondary | ICD-10-CM

## 2023-01-31 DIAGNOSIS — I10 Essential (primary) hypertension: Secondary | ICD-10-CM

## 2023-01-31 DIAGNOSIS — I3139 Other pericardial effusion (noninflammatory): Secondary | ICD-10-CM

## 2023-01-31 DIAGNOSIS — M79642 Pain in left hand: Secondary | ICD-10-CM

## 2023-01-31 DIAGNOSIS — I509 Heart failure, unspecified: Secondary | ICD-10-CM

## 2023-01-31 DIAGNOSIS — R7989 Other specified abnormal findings of blood chemistry: Secondary | ICD-10-CM

## 2023-01-31 NOTE — Patient Instructions (Addendum)
Hand Exercises Hand exercises can be helpful for almost anyone. They can strengthen your hands and improve flexibility and movement. The exercises can also increase blood flow to the hands. These results can make your work and daily tasks easier for you. Hand exercises can be especially helpful for people who have joint pain from arthritis or nerve damage from using their hands over and over. These exercises can also help people who injure a hand. Exercises Most of these hand exercises are gentle stretching and motion exercises. It is usually safe to do them often throughout the day. Warming up your hands before exercise may help reduce stiffness. You can do this with gentle massage or by placing your hands in warm water for 10-15 minutes. It is normal to feel some stretching, pulling, tightness, or mild discomfort when you begin new exercises. In time, this will improve. Remember to always be careful and stop right away if you feel sudden, very bad pain or your pain gets worse. You want to get better and be safe. Ask your health care provider which exercises are safe for you. Do exercises exactly as told by your provider and adjust them as told. Do not begin these exercises until told by your provider. Knuckle bend or "claw" fist  Stand or sit with your arm, hand, and all five fingers pointed straight up. Make sure to keep your wrist straight. Gently bend your fingers down toward your palm until the tips of your fingers are touching your palm. Keep your big knuckle straight and only bend the small knuckles in your fingers. Hold this position for 10 seconds. Straighten your fingers back to your starting position. Repeat this exercise 5-10 times with each hand. Full finger fist  Stand or sit with your arm, hand, and all five fingers pointed straight up. Make sure to keep your wrist straight. Gently bend your fingers into your palm until the tips of your fingers are touching the middle of your  palm. Hold this position for 10 seconds. Extend your fingers back to your starting position, stretching every joint fully. Repeat this exercise 5-10 times with each hand. Straight fist  Stand or sit with your arm, hand, and all five fingers pointed straight up. Make sure to keep your wrist straight. Gently bend your fingers at the big knuckle, where your fingers meet your hand, and at the middle knuckle. Keep the knuckle at the tips of your fingers straight and try to touch the bottom of your palm. Hold this position for 10 seconds. Extend your fingers back to your starting position, stretching every joint fully. Repeat this exercise 5-10 times with each hand. Tabletop  Stand or sit with your arm, hand, and all five fingers pointed straight up. Make sure to keep your wrist straight. Gently bend your fingers at the big knuckle, where your fingers meet your hand, as far down as you can. Keep the small knuckles in your fingers straight. Think of forming a tabletop with your fingers. Hold this position for 10 seconds. Extend your fingers back to your starting position, stretching every joint fully. Repeat this exercise 5-10 times with each hand. Finger spread  Place your hand flat on a table with your palm facing down. Make sure your wrist stays straight. Spread your fingers and thumb apart from each other as far as you can until you feel a gentle stretch. Hold this position for 10 seconds. Bring your fingers and thumb tight together again. Hold this position for 10 seconds. Repeat   this exercise 5-10 times with each hand. Making circles  Stand or sit with your arm, hand, and all five fingers pointed straight up. Make sure to keep your wrist straight. Make a circle by touching the tip of your thumb to the tip of your index finger. Hold for 10 seconds. Then open your hand wide. Repeat this motion with your thumb and each of your fingers. Repeat this exercise 5-10 times with each hand. Thumb  motion  Sit with your forearm resting on a table and your wrist straight. Your thumb should be facing up toward the ceiling. Keep your fingers relaxed as you move your thumb. Lift your thumb up as high as you can toward the ceiling. Hold for 10 seconds. Bend your thumb across your palm as far as you can, reaching the tip of your thumb for the small finger (pinkie) side of your palm. Hold for 10 seconds. Repeat this exercise 5-10 times with each hand. Grip strengthening  Hold a stress ball or other soft ball in the middle of your hand. Slowly increase the pressure, squeezing the ball as much as you can without causing pain. Think of bringing the tips of your fingers into the middle of your palm. All of your finger joints should bend when doing this exercise. Hold your squeeze for 10 seconds, then relax. Repeat this exercise 5-10 times with each hand. Contact a health care provider if: Your hand pain or discomfort gets much worse when you do an exercise. Your hand pain or discomfort does not improve within 2 hours after you exercise. If you have either of these problems, stop doing these exercises right away. Do not do them again unless your provider says that you can. Get help right away if: You develop sudden, severe hand pain or swelling. If this happens, stop doing these exercises right away. Do not do them again unless your provider says that you can. This information is not intended to replace advice given to you by your health care provider. Make sure you discuss any questions you have with your health care provider. Document Revised: 05/03/2022 Document Reviewed: 05/03/2022 Elsevier Patient Education  2024 [employer] Exercises for Chronic Knee Pain Chronic knee pain is pain that lasts longer than 3 months. For most people with chronic knee pain, exercise and weight loss is an important part of treatment. Your health care provider may want you to focus on: Making the muscles that  support your knee stronger. This can take pressure off your knee and reduce pain. Preventing knee stiffness. How far you can move your knee, keeping it there or making it farther. Losing weight (if this applies) to take pressure off your knee, lower your risk for injury, and make it easier for you to exercise. Your provider will help you make an exercise program that fits your needs and physical abilities. Below are simple, low-impact exercises you can do at home. Ask your provider or physical therapist how often you should do your exercise program and how many times to repeat each exercise. General safety tips  Get your provider's approval before doing any exercises. Start slowly and stop any time you feel pain. Do not exercise if your knee pain is flaring up. Warm up first. Stretching a cold muscle can cause an injury. Do 5-10 minutes of easy movement or light stretching before beginning your exercises. Do 5-10 minutes of low-impact activity (like walking or cycling) before starting strengthening exercises. Contact your provider any time you have pain during   or after exercising. Exercise can cause discomfort but should not be painful. It is normal to be a little stiff or sore after exercising. Stretching and range-of-motion exercises Front thigh stretch  Stand up straight and support your body by holding on to a chair or resting one hand on a wall. With your legs straight and close together, bend one knee to lift your heel up toward your butt. Using one hand for support, grab your ankle with your free hand. Pull your foot up closer toward your butt to feel the stretch in front of your thigh. Hold the stretch for 30 seconds. Repeat __________ times. Complete this exercise __________ times a day. Back thigh stretch  Sit on the floor with your back straight and your legs out straight in front of you. Place the palms of your hands on the floor and slide them toward your feet as you bend at the  hip. Try to touch your nose to your knees and feel the stretch in the back of your thighs. Hold for 30 seconds. Repeat __________ times. Complete this exercise __________ times a day. Calf stretch  Stand facing a wall. Place the palms of your hands flat against the wall, arms extended, and lean slightly against the wall. Get into a lunge position with one leg bent at the knee and the other leg stretched out straight behind you. Keep both feet facing the wall and increase the bend in your knee while keeping the heel of the other leg flat on the ground. You should feel the stretch in your calf. Hold for 30 seconds. Repeat __________ times. Complete this exercise __________ times a day. Strengthening exercises Straight leg lift  Lie on your back with one knee bent and the other leg out straight. Slowly lift the straight leg without bending the knee. Lift until your foot is about 12 inches (30 cm) off the floor. Hold for 3-5 seconds and slowly lower your leg. Repeat __________ times. Complete this exercise __________ times a day. Single leg dip  Stand between two chairs and put both hands on the backs of the chairs for support. Extend one leg out straight with your body weight resting on the heel of the standing leg. Slowly bend your standing knee to dip your body to the level that is comfortable for you. Hold for 3-5 seconds. Repeat __________ times. Complete this exercise __________ times a day. Hamstring curls  Stand straight, knees close together, facing the back of a chair. Hold on to the back of a chair with both hands. Keep one leg straight. Bend the other knee while bringing the heel up toward the butt until the knee is bent at a 90-degree angle (right angle). Hold for 3-5 seconds. Repeat __________ times. Complete this exercise __________ times a day. Wall squat  Stand straight with your back, hips, and head against a wall. Step forward one foot at a time with your back  still against the wall. Your feet should be 2 feet (61 cm) from the wall at shoulder width. Keeping your back, hips, and head against the wall, slide down the wall to as close to a sitting position as you can get. Hold for 5-10 seconds, then slowly slide back up. Repeat __________ times. Complete this exercise __________ times a day. Step-ups  Stand in front of a sturdy platform or stool that is about 6 inches (15 cm) high. Slowly step up with your left / right foot, keeping your knee in line with your hip   and foot. Do not let your knee bend so far that you cannot see your toes. Hold on to a chair for balance, but do not use it for support. Slowly unlock your knee and lower yourself to the starting position. Repeat __________ times. Complete this exercise __________ times a day. Contact a health care provider if: Your exercises cause pain. Your pain is worse after you exercise. Your pain prevents you from doing your exercises. This information is not intended to replace advice given to you by your health care provider. Make sure you discuss any questions you have with your health care provider. Document Revised: 05/03/2022 Document Reviewed: 05/03/2022 Elsevier Patient Education  2024 [employer] Inc.  

## 2023-01-31 NOTE — Telephone Encounter (Signed)
Pt called stating the Dr. Monica Martinez was going to refer her to a specialist for her knees. Pt is confused on why that was not mentioned in her AVS. Pt would like to know where she will be referred to and when will that be.

## 2023-01-31 NOTE — Telephone Encounter (Signed)
If labs are normal, patient does not need a follow up per Dr. Corliss Skains.  Patient can be called with lab and x-ray results.

## 2023-01-31 NOTE — Telephone Encounter (Signed)
Patient advised she was referred to physical therapist due to frequent falls and knee joint pain.  Patient advised X-rays obtained today were unremarkable.  Patient advised she should hear back from the physical therapist.

## 2023-01-31 NOTE — Telephone Encounter (Signed)
Patient was referred to physical therapist due to frequent falls and knee joint pain.  X-rays obtained today were unremarkable.  Patient should hear back from the physical therapist.

## 2023-02-02 LAB — ANTI-NUCLEAR AB-TITER (ANA TITER)
ANA TITER: 1:1280 {titer} — ABNORMAL HIGH
ANA Titer 1: 1:1280 {titer} — ABNORMAL HIGH

## 2023-02-02 LAB — ANA: Anti Nuclear Antibody (ANA): POSITIVE — AB

## 2023-02-02 LAB — PROTEIN / CREATININE RATIO, URINE
Creatinine, Urine: 75 mg/dL (ref 20–275)
Protein/Creat Ratio: 93 mg/g{creat} (ref 24–184)
Protein/Creatinine Ratio: 0.093 mg/mg{creat} (ref 0.024–0.184)
Total Protein, Urine: 7 mg/dL (ref 5–24)

## 2023-02-02 LAB — RNP ANTIBODY: Ribonucleic Protein(ENA) Antibody, IgG: <1 AI

## 2023-02-02 LAB — RHEUMATOID FACTOR: Rheumatoid fact SerPl-aCnc: 16 [IU]/mL — ABNORMAL HIGH (ref <14)

## 2023-02-02 LAB — ANTI-SMITH ANTIBODY: ENA SM Ab Ser-aCnc: <1 AI

## 2023-02-02 LAB — CYCLIC CITRUL PEPTIDE ANTIBODY, IGG: Cyclic Citrullin Peptide Ab: 17 U

## 2023-02-02 LAB — SJOGRENS SYNDROME-A EXTRACTABLE NUCLEAR ANTIBODY: SSA (Ro) (ENA) Antibody, IgG: <1 AI

## 2023-02-02 LAB — SJOGRENS SYNDROME-B EXTRACTABLE NUCLEAR ANTIBODY: SSB (La) (ENA) Antibody, IgG: <1 AI

## 2023-02-02 LAB — SEDIMENTATION RATE: Sed Rate: 14 mm/h (ref 0–30)

## 2023-02-02 LAB — ANTI-SCLERODERMA ANTIBODY: Scleroderma (Scl-70) (ENA) Antibody, IgG: <1 AI

## 2023-02-02 LAB — C3 AND C4
C3 Complement: 142 mg/dL (ref 83–193)
C4 Complement: 29 mg/dL (ref 15–57)

## 2023-02-02 LAB — ANTI-DNA ANTIBODY, DOUBLE-STRANDED: ds DNA Ab: <1 [IU]/mL

## 2023-02-07 NOTE — Telephone Encounter (Signed)
Patient has positive ANA and positive rheumatoid factor.  She should keep the follow-up visit to discuss results.

## 2023-02-14 NOTE — Progress Notes (Deleted)
Office Visit Note  Patient: Suzanne Zuniga             Date of Birth: Nov 30, 1960           MRN: [id number]             PCP: Dois Davenport, MD Referring: Dois Davenport, MD Visit Date: 02/28/2023 Occupation: @GUAROCC @  Subjective:  No chief complaint on file.   History of Present Illness: Suzanne Zuniga is a 62 y.o. female ***     Activities of Daily Living:  Patient reports morning stiffness for *** {minute/hour:19697}.   Patient {ACTIONS;DENIES/REPORTS:21021675::"Denies"} nocturnal pain.  Difficulty dressing/grooming: {ACTIONS;DENIES/REPORTS:21021675::"Denies"} Difficulty climbing stairs: {ACTIONS;DENIES/REPORTS:21021675::"Denies"} Difficulty getting out of chair: {ACTIONS;DENIES/REPORTS:21021675::"Denies"} Difficulty using hands for taps, buttons, cutlery, and/or writing: {ACTIONS;DENIES/REPORTS:21021675::"Denies"}  No Rheumatology ROS completed.   PMFS History:  Patient Active Problem List   Diagnosis Date Noted   Chest pain with low risk for cardiac etiology 04/08/2016   Obesity (BMI 30-39.9) 04/23/2014    Class: Chronic   Dyspnea 04/23/2014   Chronic heart failure (HCC) 02/13/2014   Essential hypertension    Idiopathic pericardial effusion     Past Medical History:  Diagnosis Date   Arthritis    CKD (chronic kidney disease) stage 3, GFR 30-59 ml/min (HCC)    Coronary artery disease    Essential hypertension    History of pneumonia    HTN (hypertension)    Idiopathic pericardial effusion    Uncertain etiology; was in setting of PNA has also been evaluated for inflammatory arthritis.;    MI (mitral incompetence) 2012   Palpitations    Reactive airway disease    No diagnosis listed, but on albuterol and Symbicort   Thyroid disease    HYPOTHYROID    Family History  Problem Relation Age of Onset   Heart disease Mother    Diabetes Other    Hypertension Other    Healthy Son    Healthy Son    Healthy Daughter    Past Surgical History:  Procedure  Laterality Date   PERICARDIOCENTESIS  2012   Likely parapneumonic   TRANSTHORACIC ECHOCARDIOGRAM  08/2014   normal EF of 60-65%. No regional wall motion analysis. No effusion.(~6 months post pericardiocentesis)   Social History   Social History Narrative   Single. Former smoker. Occasional alcohol.    There is no immunization history on file for this patient.   Objective: Vital Signs: There were no vitals taken for this visit.   Physical Exam   Musculoskeletal Exam: ***  CDAI Exam: CDAI Score: -- Patient Global: --; Provider Global: -- Swollen: --; Tender: -- Joint Exam 02/28/2023   No joint exam has been documented for this visit   There is currently no information documented on the homunculus. Go to the Rheumatology activity and complete the homunculus joint exam.  Investigation: No additional findings.  Imaging: XR KNEE 3 VIEW LEFT  Result Date: 01/31/2023 No medial or lateral compartment narrowing was noted.  No patellofemoral narrowing was noted.  No chondrocalcinosis was noted. Impression: Unremarkable x-rays of the knee.  XR KNEE 3 VIEW RIGHT  Result Date: 01/31/2023 No medial or lateral compartment narrowing was noted.  No patellofemoral narrowing was noted.  No chondrocalcinosis was noted. Impression: Unremarkable x-rays of the knee.  XR Hand 2 View Left  Result Date: 01/31/2023 Bilateral CMC, PIP and DIP narrowing was noted.  No MCP, intercarpal or radiocarpal joint space narrowing was noted.  No erosive changes were noted. Impression: These findings are  suggestive of osteoarthritis of the hand.  XR Hand 2 View Right  Result Date: 01/31/2023 Bilateral CMC, PIP and DIP narrowing was noted.  No MCP, intercarpal or radiocarpal joint space narrowing was noted.  No erosive changes were noted. 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   Recent Labs: Lab Results  Component Value Date   WBC 4.1 05/06/2017   HGB 14.5 05/06/2017    PLT 194 05/06/2017   NA 139 05/06/2017   K 3.6 05/06/2017   CL 105 05/06/2017   CO2 27 05/06/2017   GLUCOSE 140 (H) 05/06/2017   BUN 13 05/06/2017   CREATININE 0.94 05/06/2017   BILITOT 1.5 (H) 03/26/2013   ALKPHOS 243 (H) 03/26/2013   AST 42 (H) 03/26/2013   ALT 38 (H) 03/26/2013   PROT 8.0 03/26/2013   ALBUMIN 4.3 03/26/2013   CALCIUM 9.3 05/06/2017   GFRAA >60 05/06/2017   January 31, 2023 ANA 1: 1280 cytoplasmic, 1: 1280 NS, ENA (SCL 70, RNP, Smith, SSA, SSB, dsDNA) negative, C3-C4 normal, urine protein creatinine ratio normal, RF 16, anti-CCP 17, ESR 14  Speciality Comments: No specialty comments available.  Procedures:  No procedures performed Allergies: Aliskiren, Amlodipine, Lisinopril, and Lithium   Assessment / Plan:     Visit Diagnoses: No diagnosis found.  Orders: No orders of the defined types were placed in this encounter.  No orders of the defined types were placed in this encounter.   Face-to-face time spent with patient was *** minutes. Greater than 50% of time was spent in counseling and coordination of care.  Follow-Up Instructions: No follow-ups on file.   Pollyann Savoy, MD  Note - This record has been created using Animal nutritionist.  Chart creation errors have been sought, but may not always  have been located. Such creation errors do not reflect on  the standard of medical care.

## 2023-02-28 ENCOUNTER — Ambulatory Visit: Payer: Medicaid Other | Admitting: Rheumatology

## 2023-02-28 DIAGNOSIS — M79641 Pain in right hand: Secondary | ICD-10-CM

## 2023-02-28 DIAGNOSIS — M2550 Pain in unspecified joint: Secondary | ICD-10-CM

## 2023-02-28 DIAGNOSIS — G8929 Other chronic pain: Secondary | ICD-10-CM

## 2023-02-28 DIAGNOSIS — I509 Heart failure, unspecified: Secondary | ICD-10-CM

## 2023-02-28 DIAGNOSIS — R296 Repeated falls: Secondary | ICD-10-CM

## 2023-02-28 DIAGNOSIS — R7989 Other specified abnormal findings of blood chemistry: Secondary | ICD-10-CM

## 2023-02-28 DIAGNOSIS — I10 Essential (primary) hypertension: Secondary | ICD-10-CM

## 2023-02-28 DIAGNOSIS — R768 Other specified abnormal immunological findings in serum: Secondary | ICD-10-CM

## 2023-02-28 DIAGNOSIS — I3139 Other pericardial effusion (noninflammatory): Secondary | ICD-10-CM

## 2023-08-05 LAB — LAB REPORT - SCANNED
A1c: 5.3
Calcium: 9.7
EGFR: 60
Free T4: 0.64 ng/dL
PTH: 27
TSH: 45.3 — AB (ref 0.41–5.90)

## 2023-12-26 LAB — LAB REPORT - SCANNED
A1c: 5.2
EGFR: 65
Free T4: 0.49 ng/dL
HM HIV Screening: NEGATIVE
TSH: 38.1 — AB (ref 0.41–5.90)

## 2024-03-14 ENCOUNTER — Ambulatory Visit: Payer: Self-pay

## 2024-03-14 NOTE — Telephone Encounter (Signed)
 FYI Only or Action Required?: FYI only for provider: call transferred to Pulm agent for scheduling a np appt.  Patient is followed in Pulmonology for not a pulm pt, last seen on .  Called Nurse Triage reporting no symptoms  Symptoms began pt has hx of lung issues.  Interventions attempted: Other: has been a pum pt in the past.  Symptoms are: no issues at this time.  Triage Disposition: No disposition on file.  Patient/caregiver understands and will follow disposition?: Pt wants to establish as a NP with pulm              Copied from CRM [id number]. Topic: Clinical - Red Word Triage >> Mar 14, 2024  9:36 AM Isabell A wrote: Red Word that prompted transfer to Nurse Triage: Patient states she has a collapsed left lung - experiencing SOB Reason for Disposition  Requesting regular office appointment  Answer Assessment - Initial Assessment Questions 1. REASON FOR CALL: What is the main reason for your call? or How can I best help you?     Pt wants to become a NP with pulmonary 2. SYMPTOMS : Do you have any symptoms?      no 3. OTHER QUESTIONS: Do you have any other questions?     Pt has a collapsed lung since 2012  Protocols used: Information Only Call - No Triage-A-AH

## 2024-04-09 ENCOUNTER — Ambulatory Visit: Admitting: Internal Medicine

## 2024-04-09 NOTE — Progress Notes (Deleted)
 Suzanne Zuniga, female    DOB: 08-Sep-1960    MRN: [id number]   Brief patient profile:  63  yo*** *** referred to pulmonary clinic in [place]  04/09/2024 by *** for ***    Pt not previously seen by PCCM service.     History of Present Illness  04/09/2024  Pulmonary/ 1st office eval/ Jess Toney / [place] Office  No chief complaint on file.    Dyspnea:  *** Cough: *** Sleep: *** SABA use: *** 02: *** LDSCT:***  No obvious day to day or daytime pattern/variability or assoc excess/ purulent sputum or mucus plugs or hemoptysis or cp or chest tightness, subjective wheeze or overt sinus or hb symptoms.    Also denies any obvious fluctuation of symptoms with weather or environmental changes or other aggravating or alleviating factors except as outlined above   No unusual exposure hx or h/o childhood pna/ asthma or knowledge of premature birth.  Current Allergies, Complete Past Medical History, Past Surgical History, Family History, and Social History were reviewed in [employer] record.  ROS  The following are not active complaints unless bolded Hoarseness, sore throat, dysphagia, dental problems, itching, sneezing,  nasal congestion or discharge of excess mucus or purulent secretions, ear ache,   fever, chills, sweats, unintended wt loss or wt gain, classically pleuritic or exertional cp,  orthopnea pnd or arm/hand swelling  or leg swelling, presyncope, palpitations, abdominal pain, anorexia, nausea, vomiting, diarrhea  or change in bowel habits or change in bladder habits, change in stools or change in urine, dysuria, hematuria,  rash, arthralgias, visual complaints, headache, numbness, weakness or ataxia or problems with walking or coordination,  change in mood or  memory.            Outpatient Medications Prior to Visit  Medication Sig Dispense Refill   albuterol (VENTOLIN HFA) 108 (90 Base) MCG/ACT inhaler USE AS DIRECTED 2 PUFFS TWICE A DAY AS NEEDED      aspirin EC 81 MG tablet Take 81 mg by mouth every morning.     budesonide-formoterol (SYMBICORT) 160-4.5 MCG/ACT inhaler Inhale 2 puffs into the lungs 2 (two) times daily.     carvedilol (COREG) 25 MG tablet TAKE 1 TABLET BY MOUTH TWICE DAILY FOR heart rate AND blood pressure     cephALEXin  (KEFLEX ) 500 MG capsule Take 1 capsule (500 mg total) by mouth 2 (two) times daily. (Patient not taking: Reported on 01/31/2023) 14 capsule 0   Cetirizine  HCl 10 MG CAPS Take 1 capsule (10 mg total) by mouth at bedtime. 30 capsule 0   diltiazem (DILACOR XR) 180 MG 24 hr capsule Take 180 mg by mouth daily. (Patient not taking: Reported on 01/31/2023)     fluticasone  (FLONASE ) 50 MCG/ACT nasal spray Place 2 sprays into both nostrils daily. 16 g 0   HYDROcodone -acetaminophen  (NORCO/VICODIN) 5-325 MG tablet Take 1 tablet by mouth every 4 (four) hours as needed. 6 tablet 0   ibuprofen  (ADVIL ,MOTRIN ) 600 MG tablet Take 1 tablet (600 mg total) by mouth every 6 (six) hours as needed. 30 tablet 0   levothyroxine (SYNTHROID, LEVOTHROID) 100 MCG tablet Take 100 mcg by mouth daily.     losartan (COZAAR) 100 MG tablet Take 100 mg by mouth every morning.     promethazine -dextromethorphan (PROMETHAZINE -DM) 6.25-15 MG/5ML syrup Take 5 mLs by mouth every 6 (six) hours as needed. (Patient not taking: Reported on 01/31/2023) 118 mL 0   No facility-administered medications prior to visit.    Past Medical  History:  Diagnosis Date   Arthritis    CKD (chronic kidney disease) stage 3, GFR 30-59 ml/min (HCC)    Coronary artery disease    Essential hypertension    History of pneumonia    HTN (hypertension)    Idiopathic pericardial effusion    Uncertain etiology; was in setting of PNA has also been evaluated for inflammatory arthritis.;    MI (mitral incompetence) 2012   Palpitations    Reactive airway disease    No diagnosis listed, but on albuterol and Symbicort   Thyroid disease    HYPOTHYROID      Objective:      There were no vitals taken for this visit.         Assessment

## 2024-05-16 ENCOUNTER — Encounter: Payer: Self-pay | Admitting: Internal Medicine

## 2024-05-16 ENCOUNTER — Ambulatory Visit (INDEPENDENT_AMBULATORY_CARE_PROVIDER_SITE_OTHER): Admitting: Internal Medicine

## 2024-05-16 ENCOUNTER — Ambulatory Visit (HOSPITAL_COMMUNITY)
Admission: RE | Admit: 2024-05-16 | Discharge: 2024-05-16 | Disposition: A | Discharge status: Home / Self Care | Source: Ambulatory Visit | Attending: Internal Medicine | Admitting: Internal Medicine

## 2024-05-16 VITALS — BP 198/78 | HR 66 | Ht 64.0 in | Wt 181.0 lb

## 2024-05-16 DIAGNOSIS — R058 Other specified cough: Secondary | ICD-10-CM | POA: Insufficient documentation

## 2024-05-16 DIAGNOSIS — Z87891 Personal history of nicotine dependence: Secondary | ICD-10-CM

## 2024-05-16 DIAGNOSIS — R0609 Other forms of dyspnea: Secondary | ICD-10-CM | POA: Diagnosis not present

## 2024-05-16 NOTE — Progress Notes (Addendum)
 "   Suzanne Zuniga, female    DOB: Apr 28, 1961    MRN: [id number]   Brief patient profile:  84  yobf  quit smoking 1990s  referred to pulmonary clinic in [place]  05/16/2024 by Alfonso PA  with Dr Burney  with onset   2012 sob / cough  admitted to [place] 5 different surgeries for fluid around heart  and never quite the same   Pt not previously seen by [facility] service.     History of Present Illness  05/16/2024  Pulmonary/ 1st office eval/ Corisa Montini / [place] Office  Chief Complaint  Patient presents with   Establish Care    Collapsed left lung - needs closer dr to handle her copd and lung issue. Mucus clear today   Dyspnea:  no activity where breathing limits her in any way  Cough: tickle in throat > clear mucus night > day  Sleep: bed is flat/ one pillow nose runs and cough most night  SABA use:  uses it more spring = fall  02: none     No obvious day to day or daytime pattern/variability or assoc excess/ purulent sputum or mucus plugs or hemoptysis or cp or chest tightness, subjective wheeze or overt sinus or hb symptoms.    Also denies any obvious fluctuation of symptoms with weather or environmental changes or other aggravating or alleviating factors except as outlined above   No unusual exposure hx or h/o childhood pna/ asthma or knowledge of premature birth.  Current Allergies, Complete Past Medical History, Past Surgical History, Family History, and Social History were reviewed in [employer] record.  ROS  The following are not active complaints unless bolded Hoarseness, sore throat, dysphagia= globus sensation  dental problems, itching, sneezing,  nasal congestion or discharge of excess mucus or purulent secretions, ear ache,   fever, chills, sweats, unintended wt loss or wt gain, classically pleuritic or exertional cp,  orthopnea pnd or arm/hand swelling  or leg swelling, presyncope, palpitations, abdominal pain, anorexia, nausea, vomiting, diarrhea  or  change in bowel habits or change in bladder habits, change in stools or change in urine, dysuria, hematuria,  rash, arthralgias, visual complaints, headache, numbness, weakness or ataxia or problems with walking or coordination,  change in mood or  memory.            Outpatient Medications Prior to Visit  Medication Sig Dispense Refill   albuterol (VENTOLIN HFA) 108 (90 Base) MCG/ACT inhaler USE AS DIRECTED 2 PUFFS TWICE A DAY AS NEEDED     aspirin EC 81 MG tablet Take 81 mg by mouth every morning.     budesonide-formoterol (SYMBICORT) 160-4.5 MCG/ACT inhaler Inhale 2 puffs into the lungs 2 (two) times daily.     carvedilol (COREG) 25 MG tablet TAKE 1 TABLET BY MOUTH TWICE DAILY FOR heart rate AND blood pressure     Cetirizine  HCl 10 MG CAPS Take 1 capsule (10 mg total) by mouth at bedtime. 30 capsule 0   fluticasone  (FLONASE ) 50 MCG/ACT nasal spray Place 2 sprays into both nostrils daily. 16 g 0   ibuprofen  (ADVIL ,MOTRIN ) 600 MG tablet Take 1 tablet (600 mg total) by mouth every 6 (six) hours as needed. 30 tablet 0   levothyroxine (SYNTHROID, LEVOTHROID) 100 MCG tablet Take 100 mcg by mouth daily.     losartan (COZAAR) 100 MG tablet Take 100 mg by mouth every morning.     promethazine -dextromethorphan (PROMETHAZINE -DM) 6.25-15 MG/5ML syrup Take 5 mLs by mouth every 6 (  six) hours as needed. 118 mL 0   triamcinolone cream (KENALOG) 0.1 % Apply topically 2 (two) times daily.     cephALEXin  (KEFLEX ) 500 MG capsule Take 1 capsule (500 mg total) by mouth 2 (two) times daily. (Patient not taking: Reported on 05/16/2024) 14 capsule 0   diltiazem (DILACOR XR) 180 MG 24 hr capsule Take 180 mg by mouth daily. (Patient not taking: Reported on 05/16/2024)     HYDROcodone -acetaminophen  (NORCO/VICODIN) 5-325 MG tablet Take 1 tablet by mouth every 4 (four) hours as needed. (Patient not taking: Reported on 05/16/2024) 6 tablet 0   No facility-administered medications prior to visit.    Past Medical History:   Diagnosis Date   Arthritis    CKD (chronic kidney disease) stage 3, GFR 30-59 ml/min (HCC)    Coronary artery disease    Essential hypertension    History of pneumonia    HTN (hypertension)    Idiopathic pericardial effusion    Uncertain etiology; was in setting of PNA has also been evaluated for inflammatory arthritis.;    MI (mitral incompetence) 2012   Palpitations    Reactive airway disease    No diagnosis listed, but on albuterol and Symbicort   Thyroid disease    HYPOTHYROID      Objective:     BP (!) 198/78   Pulse 66   Ht 5' 4 (1.626 m)   Wt 181 lb (82.1 kg)   SpO2 95% Comment: ra  BMI 31.07 kg/m   SpO2: 95 % (ra) somber bf focused on relating her diagnoses and not her present symptoms - note bp elevation prior to taking am meds    HEENT : Oropharynx  clear      Nasal turbinates nl    NECK :  without  apparent JVD/ palpable Nodes/TM    LUNGS: no acc muscle use,  Nl contour chest which is clear to A and P bilaterally without cough on insp or exp maneuvers   CV:  RRR  no s3 or murmur or increase in P2, and no edema   ABD:  soft and nontender   MS:  Gait nl   ext warm without deformities Or obvious joint restrictions  calf tenderness, cyanosis or clubbing    SKIN: warm and dry without lesions    NEURO:  alert, approp, nl sensorium with  no motor or cerebellar deficits apparent.      CXR PA and Lateral:   05/16/2024 :    I personally reviewed images and impression is as follows:     Mild/ mod kyphosis, no significant  as dz or ATX  Assessment   Assessment & Plan Upper airway cough syndrome Onset 2012 p 5 heart surgeries  in [place]  - Allergy screen 05/16/2024 >  Eos 0. /  IgE     -  trial of 1st gen H1 for noct pnds (vs globus more likely)   Upper airway cough syndrome (previously labeled PNDS),  is so named because it's frequently impossible to sort out how much is  CR/sinusitis with freq throat clearing (which can be related to primary GERD)    vs  causing  secondary ( extra esophageal)  GERD from wide swings in gastric pressure that occur with throat clearing, often  promoting self use of mint and menthol lozenges that reduce the lower esophageal sphincter tone and exacerbate the problem further in a cyclical fashion.   These are the same pts (now being labeled as having irritable larynx syndrome  by some cough centers) who not infrequently have a history of having failed to tolerate ace inhibitors(as is the case here) ,  dry powder inhalers or biphosphonates or report having atypical/extraesophageal reflux symptoms from LPR (globus, throat clearing which may be the case here)  that don't respond to standard doses of PPI  and are easily confused as having aecopd or asthma flares by even experienced allergists/ pulmonologists (myself included).   We'll try 1st gen H1 blockers per guidelines  and refer to allergy if screen positive but if negative just wait and see her back in her worst season  ie this spring    DOE (dyspnea on exertion) Quit smoking 1990s but not right since heart surgery 2012  - could not name and activity limited by doe at 1st pulmonary eval 05/16/2024  - 05/16/2024   Walked on RA  x  3  lap(s) =  approx 450  ft  @ mod  pace, stopped due to end of study  with lowest 02 sats 95% and min sob    No able to evaluate this further today - will ask her to return in spring with all her inhalers in meantime prn if limiting doe develps  Discussed in detail all the  indications, usual  risks and alternatives  relative to the benefits with patient who agrees to proceed with conservative f/u as outlined    Each maintenance medication was reviewed in detail including emphasizing most importantly the difference between maintenance and prns and under what circumstances the prns are to be triggered using an action plan format where appropriate.  Total time for H and P, chart review, counseling, reviewing hfa device(s) , directly  observing portions of ambulatory 02 saturation study/ and generating customized AVS unique to this office visit / same day charting = 50 min new pt eval  for chronic  refractory vague respiratory  symptoms of uncertain etiology                  AVS  Patient Instructions  Stop certrizine and try For drainage / throat tickle try take CHLORPHENIRAMINE  4 mg  (Allergy Relief 4mg   at [facility] should be easiest to find in the blue box usually on bottom shelf)  take one every 4 hours as needed - extremely effective and inexpensive over the counter- may cause drowsiness so start with just a dose or two an hour before bedtime and see how you tolerate it before trying in daytime.   Please remember to go to the lab department   for your tests - we will call you with the results when they are available.      Please remember to go to the  x-ray department  @  [facility] for your tests - we will call you with the results when they are available      Please schedule a follow up visit in 3 months but call sooner if needed  with all  RESPIRATORY medications /inhalers/ solutions in hand so we can verify exactly what you are taking. This includes all medications from all doctors and over the counters          Ozell America, MD 05/16/2024      "

## 2024-05-16 NOTE — Patient Instructions (Addendum)
 Stop certrizine and try For drainage / throat tickle try take CHLORPHENIRAMINE  4 mg  (Allergy Relief 4mg   at [facility] should be easiest to find in the blue box usually on bottom shelf)  take one every 4 hours as needed - extremely effective and inexpensive over the counter- may cause drowsiness so start with just a dose or two an hour before bedtime and see how you tolerate it before trying in daytime.   Please remember to go to the lab department   for your tests - we will call you with the results when they are available.      Please remember to go to the  x-ray department  @  [facility] for your tests - we will call you with the results when they are available      Please schedule a follow up visit in 3 months but call sooner if needed  with all  RESPIRATORY medications /inhalers/ solutions in hand so we can verify exactly what you are taking. This includes all medications from all doctors and over the counters

## 2024-05-16 NOTE — Assessment & Plan Note (Addendum)
 Onset 2012 p 5 heart surgeries  in [place]  - Allergy screen 05/16/2024 >  Eos 0. /  IgE     -  trial of 1st gen H1 for noct pnds (vs globus more likely)   Upper airway cough syndrome (previously labeled PNDS),  is so named because it's frequently impossible to sort out how much is  CR/sinusitis with freq throat clearing (which can be related to primary GERD)   vs  causing  secondary ( extra esophageal)  GERD from wide swings in gastric pressure that occur with throat clearing, often  promoting self use of mint and menthol lozenges that reduce the lower esophageal sphincter tone and exacerbate the problem further in a cyclical fashion.   These are the same pts (now being labeled as having irritable larynx syndrome by some cough centers) who not infrequently have a history of having failed to tolerate ace inhibitors(as is the case here) ,  dry powder inhalers or biphosphonates or report having atypical/extraesophageal reflux symptoms from LPR (globus, throat clearing which may be the case here)  that don't respond to standard doses of PPI  and are easily confused as having aecopd or asthma flares by even experienced allergists/ pulmonologists (myself included).   We'll try 1st gen H1 blockers per guidelines  and refer to allergy if screen positive but if negative just wait and see her back in her worst season  ie this spring

## 2024-05-16 NOTE — Assessment & Plan Note (Addendum)
 Quit smoking 1990s but not right since heart surgery 2012  - could not name and activity limited by doe at 1st pulmonary eval 05/16/2024  - 05/16/2024   Walked on RA  x  3  lap(s) =  approx 450  ft  @ mod  pace, stopped due to end of study  with lowest 02 sats 95% and min sob    No able to evaluate this further today - will ask her to return in spring with all her inhalers in meantime prn if limiting doe develps  Discussed in detail all the  indications, usual  risks and alternatives  relative to the benefits with patient who agrees to proceed with conservative f/u as outlined    Each maintenance medication was reviewed in detail including emphasizing most importantly the difference between maintenance and prns and under what circumstances the prns are to be triggered using an action plan format where appropriate.  Total time for H and P, chart review, counseling, reviewing hfa device(s) , directly observing portions of ambulatory 02 saturation study/ and generating customized AVS unique to this office visit / same day charting = 50 min new pt eval  for chronic  refractory vague respiratory  symptoms of uncertain etiology

## 2024-05-19 ENCOUNTER — Ambulatory Visit: Payer: Self-pay | Admitting: Internal Medicine

## 2024-05-19 LAB — IGE: IgE (Immunoglobulin E), Serum: 13 [IU]/mL (ref 6–495)

## 2024-05-19 LAB — CBC WITH DIFFERENTIAL/PLATELET
Basophils Absolute: 0 x10E3/uL (ref 0.0–0.2)
Basos: 1 %
EOS (ABSOLUTE): 0.1 x10E3/uL (ref 0.0–0.4)
Eos: 2 %
Hematocrit: 44.1 % (ref 34.0–46.6)
Hemoglobin: 14.7 g/dL (ref 11.1–15.9)
Immature Grans (Abs): 0 x10E3/uL (ref 0.0–0.1)
Immature Granulocytes: 0 %
Lymphocytes Absolute: 1.4 x10E3/uL (ref 0.7–3.1)
Lymphs: 32 %
MCH: 32.2 pg (ref 26.6–33.0)
MCHC: 33.3 g/dL (ref 31.5–35.7)
MCV: 97 fL (ref 79–97)
Monocytes Absolute: 0.4 x10E3/uL (ref 0.1–0.9)
Monocytes: 8 %
Neutrophils Absolute: 2.5 x10E3/uL (ref 1.4–7.0)
Neutrophils: 57 %
Platelets: 231 x10E3/uL (ref 150–450)
RBC: 4.56 x10E6/uL (ref 3.77–5.28)
RDW: 13.1 % (ref 11.7–15.4)
WBC: 4.4 x10E3/uL (ref 3.4–10.8)

## 2024-05-20 NOTE — Progress Notes (Signed)
 Called and relayed results to pt and pt confirmed understanding

## 2024-05-23 NOTE — Progress Notes (Signed)
 Called and relayed results to pt and pt confirmed understanding

## 2024-08-14 ENCOUNTER — Ambulatory Visit: Admitting: Internal Medicine
